# Patient Record
Sex: Female | Born: 1985 | Race: White | Hispanic: No | Smoking: Current every day smoker
Health system: Southern US, Community
[De-identification: ages and names within clinical notes are randomized; demographics above are authoritative.]

## PROBLEM LIST (undated history)

## (undated) DIAGNOSIS — F909 Attention-deficit hyperactivity disorder, unspecified type: Secondary | ICD-10-CM

## (undated) DIAGNOSIS — O009 Unspecified ectopic pregnancy without intrauterine pregnancy: Secondary | ICD-10-CM

## (undated) DIAGNOSIS — J45909 Unspecified asthma, uncomplicated: Secondary | ICD-10-CM

## (undated) DIAGNOSIS — F419 Anxiety disorder, unspecified: Secondary | ICD-10-CM

## (undated) DIAGNOSIS — R011 Cardiac murmur, unspecified: Secondary | ICD-10-CM

## (undated) DIAGNOSIS — F4310 Post-traumatic stress disorder, unspecified: Secondary | ICD-10-CM

## (undated) HISTORY — PX: BACK SURGERY: SHX140

## (undated) HISTORY — DX: Unspecified ectopic pregnancy without intrauterine pregnancy: O00.90

## (undated) HISTORY — DX: Unspecified asthma, uncomplicated: J45.909

---

## 2004-01-30 ENCOUNTER — Emergency Department: Payer: Self-pay | Admitting: Emergency Medicine

## 2004-04-26 ENCOUNTER — Emergency Department: Payer: Self-pay | Admitting: Unknown Physician Specialty

## 2004-08-10 ENCOUNTER — Emergency Department: Payer: Self-pay | Admitting: Unknown Physician Specialty

## 2004-10-18 ENCOUNTER — Emergency Department: Payer: Self-pay | Admitting: Emergency Medicine

## 2004-10-20 ENCOUNTER — Ambulatory Visit: Payer: Self-pay | Admitting: Emergency Medicine

## 2004-12-30 ENCOUNTER — Observation Stay: Payer: Self-pay

## 2005-02-15 ENCOUNTER — Observation Stay: Payer: Self-pay | Admitting: Unknown Physician Specialty

## 2005-04-14 ENCOUNTER — Inpatient Hospital Stay: Payer: Self-pay | Admitting: Unknown Physician Specialty

## 2005-05-10 ENCOUNTER — Emergency Department: Payer: Self-pay | Admitting: Emergency Medicine

## 2005-11-22 ENCOUNTER — Emergency Department: Payer: Self-pay | Admitting: Emergency Medicine

## 2006-07-21 ENCOUNTER — Emergency Department: Payer: Self-pay | Admitting: Emergency Medicine

## 2006-12-06 ENCOUNTER — Emergency Department: Payer: Self-pay | Admitting: Emergency Medicine

## 2007-07-18 ENCOUNTER — Emergency Department: Payer: Self-pay | Admitting: Emergency Medicine

## 2007-10-13 ENCOUNTER — Emergency Department: Payer: Self-pay | Admitting: Emergency Medicine

## 2009-05-19 ENCOUNTER — Emergency Department: Payer: Self-pay | Admitting: Emergency Medicine

## 2009-08-31 ENCOUNTER — Emergency Department: Payer: Self-pay | Admitting: Internal Medicine

## 2009-09-27 ENCOUNTER — Emergency Department: Payer: Self-pay | Admitting: Emergency Medicine

## 2009-10-10 ENCOUNTER — Emergency Department: Payer: Self-pay | Admitting: Emergency Medicine

## 2010-04-10 ENCOUNTER — Emergency Department: Payer: Self-pay | Admitting: Emergency Medicine

## 2010-10-06 ENCOUNTER — Emergency Department: Payer: Self-pay | Admitting: Emergency Medicine

## 2011-03-24 ENCOUNTER — Emergency Department: Payer: Self-pay | Admitting: *Deleted

## 2011-05-05 ENCOUNTER — Emergency Department: Payer: Self-pay | Admitting: Emergency Medicine

## 2011-05-05 LAB — ACETAMINOPHEN LEVEL: Acetaminophen: <2 ug/mL

## 2011-05-05 LAB — URINALYSIS, COMPLETE
Bilirubin,UR: NEGATIVE
Ketone: NEGATIVE
Nitrite: NEGATIVE
Ph: 6 (ref 4.5–8.0)
Protein: NEGATIVE
RBC,UR: 1 /HPF (ref 0–5)
Specific Gravity: 1.011 (ref 1.003–1.030)
Squamous Epithelial: 13

## 2011-05-05 LAB — COMPREHENSIVE METABOLIC PANEL
Albumin: 4.3 g/dL (ref 3.4–5.0)
BUN: 8 mg/dL (ref 7–18)
EGFR (African American): >60
EGFR (Non-African Amer.): >60
Glucose: 72 mg/dL (ref 65–99)
SGOT(AST): 34 U/L (ref 15–37)
SGPT (ALT): 22 U/L
Total Protein: 8 g/dL (ref 6.4–8.2)

## 2011-05-05 LAB — CBC
HGB: 14 g/dL (ref 12.0–16.0)
MCH: 30.5 pg (ref 26.0–34.0)
Platelet: 340 10*3/uL (ref 150–440)
RBC: 4.61 10*6/uL (ref 3.80–5.20)
WBC: 8.4 10*3/uL (ref 3.6–11.0)

## 2011-05-05 LAB — DRUG SCREEN, URINE
Amphetamines, Ur Screen: NEGATIVE (ref ?–1000)
Barbiturates, Ur Screen: NEGATIVE (ref ?–200)
Benzodiazepine, Ur Scrn: POSITIVE (ref ?–200)
Cannabinoid 50 Ng, Ur ~~LOC~~: NEGATIVE (ref ?–50)
Cocaine Metabolite,Ur ~~LOC~~: NEGATIVE (ref ?–300)
Methadone, Ur Screen: NEGATIVE (ref ?–300)
Phencyclidine (PCP) Ur S: NEGATIVE (ref ?–25)
Tricyclic, Ur Screen: NEGATIVE (ref ?–1000)

## 2011-05-05 LAB — PREGNANCY, URINE: Pregnancy Test, Urine: NEGATIVE m[IU]/mL

## 2011-05-05 LAB — TROPONIN I: Troponin-I: <0.02 ng/mL

## 2011-05-05 LAB — SALICYLATE LEVEL: Salicylates, Serum: <1.7 mg/dL

## 2011-05-05 LAB — ETHANOL: Ethanol: 28 mg/dL

## 2011-05-30 ENCOUNTER — Emergency Department: Payer: Self-pay | Admitting: Emergency Medicine

## 2011-05-30 LAB — URINALYSIS, COMPLETE
Bilirubin,UR: NEGATIVE
Glucose,UR: NEGATIVE mg/dL (ref 0–75)
Ketone: NEGATIVE
Protein: NEGATIVE
RBC,UR: 1 /HPF (ref 0–5)
Specific Gravity: 1.002 (ref 1.003–1.030)
WBC UR: 5 /HPF (ref 0–5)

## 2011-05-30 LAB — CBC
HCT: 40 % (ref 35.0–47.0)
HGB: 13.8 g/dL (ref 12.0–16.0)
MCH: 31.7 pg (ref 26.0–34.0)
RBC: 4.35 10*6/uL (ref 3.80–5.20)
WBC: 8.8 10*3/uL (ref 3.6–11.0)

## 2011-05-30 LAB — COMPREHENSIVE METABOLIC PANEL
Albumin: 4.2 g/dL (ref 3.4–5.0)
Alkaline Phosphatase: 63 U/L (ref 50–136)
BUN: 9 mg/dL (ref 7–18)
Bilirubin,Total: 0.2 mg/dL (ref 0.2–1.0)
Chloride: 104 mmol/L (ref 98–107)
Creatinine: 0.89 mg/dL (ref 0.60–1.30)
EGFR (Non-African Amer.): >60
SGPT (ALT): 15 U/L
Sodium: 144 mmol/L (ref 136–145)

## 2011-05-30 LAB — HCG, QUANTITATIVE, PREGNANCY: Beta Hcg, Quant.: <1 m[IU]/mL — ABNORMAL LOW

## 2011-08-23 ENCOUNTER — Emergency Department: Payer: Self-pay | Admitting: Emergency Medicine

## 2011-08-23 LAB — URINALYSIS, COMPLETE
Bacteria: NONE SEEN
Bilirubin,UR: NEGATIVE
Blood: NEGATIVE
Glucose,UR: NEGATIVE mg/dL (ref 0–75)
Ketone: NEGATIVE
Leukocyte Esterase: NEGATIVE
Nitrite: NEGATIVE
Ph: 6 (ref 4.5–8.0)
Protein: NEGATIVE
Transitional Epi: <1

## 2011-08-23 LAB — COMPREHENSIVE METABOLIC PANEL
Anion Gap: 8 (ref 7–16)
BUN: 11 mg/dL (ref 7–18)
Chloride: 107 mmol/L (ref 98–107)
Co2: 26 mmol/L (ref 21–32)
Creatinine: 0.92 mg/dL (ref 0.60–1.30)
Potassium: 3.6 mmol/L (ref 3.5–5.1)
SGPT (ALT): 13 U/L
Sodium: 141 mmol/L (ref 136–145)
Total Protein: 8.2 g/dL (ref 6.4–8.2)

## 2011-08-23 LAB — CBC
HCT: 42.1 % (ref 35.0–47.0)
HGB: 14.5 g/dL (ref 12.0–16.0)
MCH: 31.7 pg (ref 26.0–34.0)
MCHC: 34.4 g/dL (ref 32.0–36.0)
Platelet: 360 10*3/uL (ref 150–440)
RBC: 4.57 10*6/uL (ref 3.80–5.20)
RDW: 12.1 % (ref 11.5–14.5)

## 2011-08-23 LAB — WET PREP, GENITAL

## 2011-08-23 LAB — PREGNANCY, URINE: Pregnancy Test, Urine: NEGATIVE m[IU]/mL

## 2011-10-29 ENCOUNTER — Encounter: Payer: Self-pay | Admitting: Maternal & Fetal Medicine

## 2012-05-10 IMAGING — CT CT HEAD WITHOUT CONTRAST
2 series · 16 of 30 positions shown, 20 images · non-contrast
Comparison: none

REASON FOR EXAM: syncope pt is pregnant/ has had blaeeding and no iup
seen on us/poss incompl ab/
COMMENTS:

[Series 2: without · axial · non-contrast · 0.41mm/px · z∈[+1374,+1494]mm · 13 of 29 slices shown, 17 images]
[im 3/29  brain]
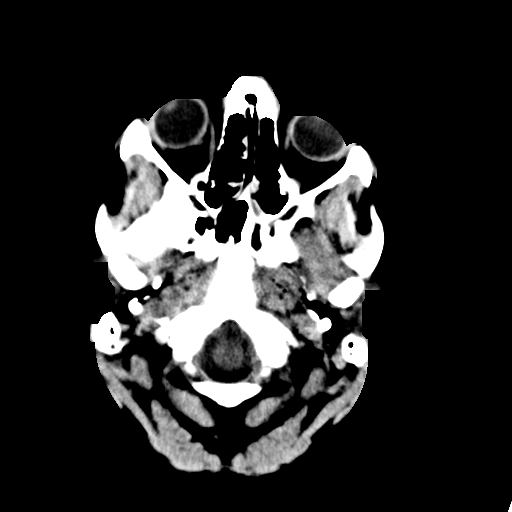
[im 3/29  bone]
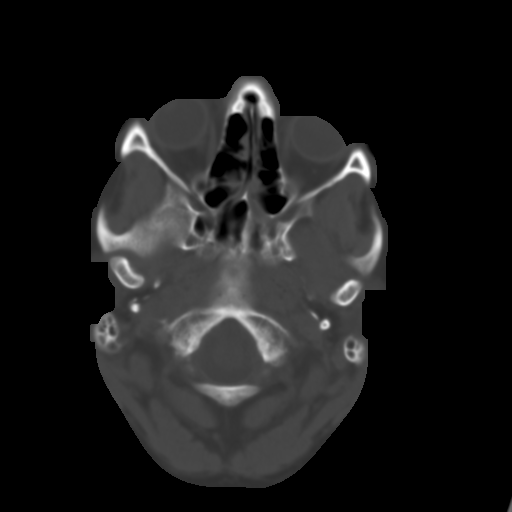
[im 5/29  brain]
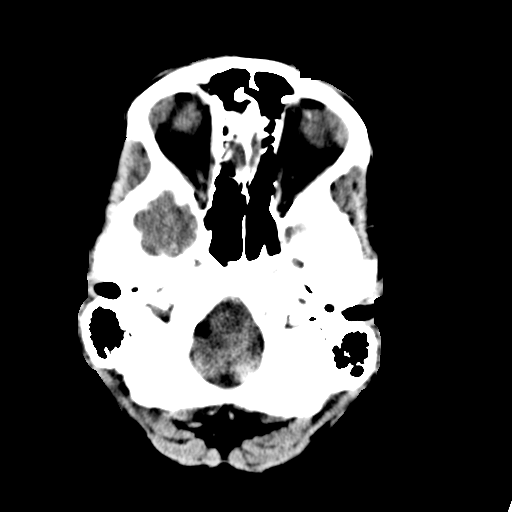
[im 7/29  brain]
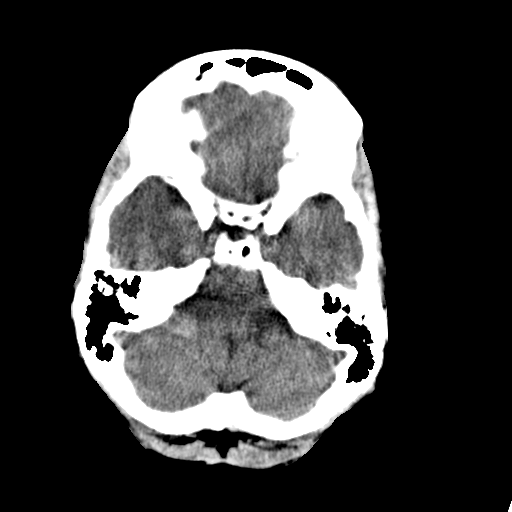
[im 9/29  brain]
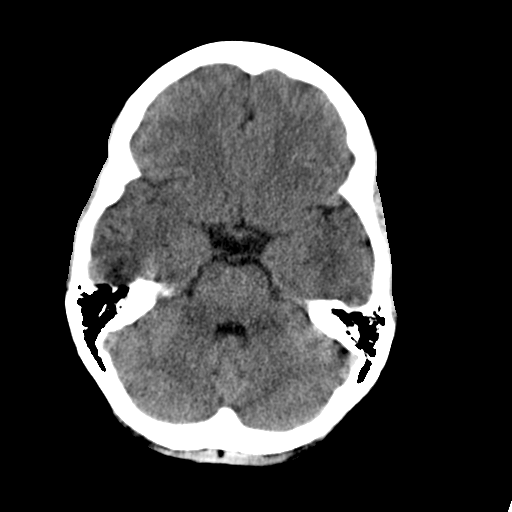
[im 11/29  brain]
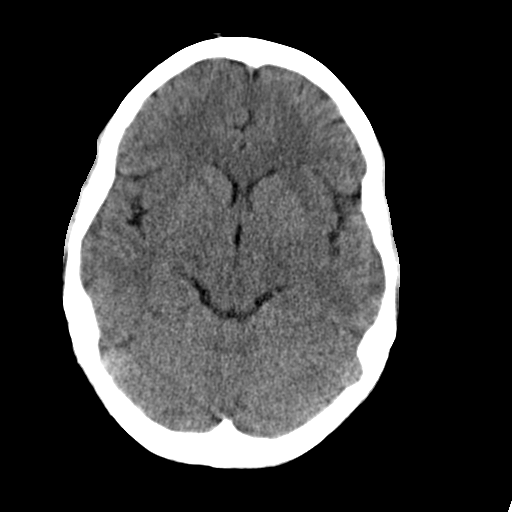
[im 11/29  bone]
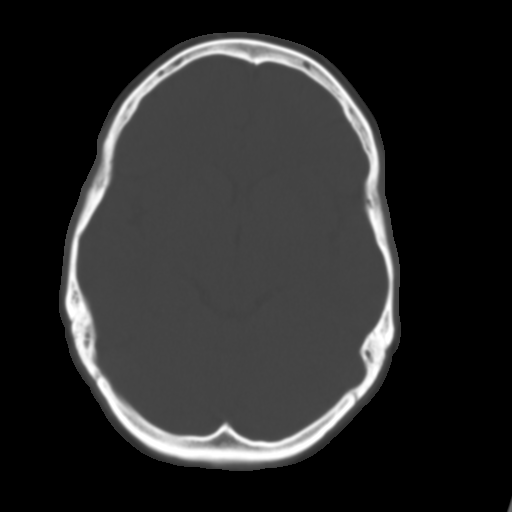
[im 13/29  brain]
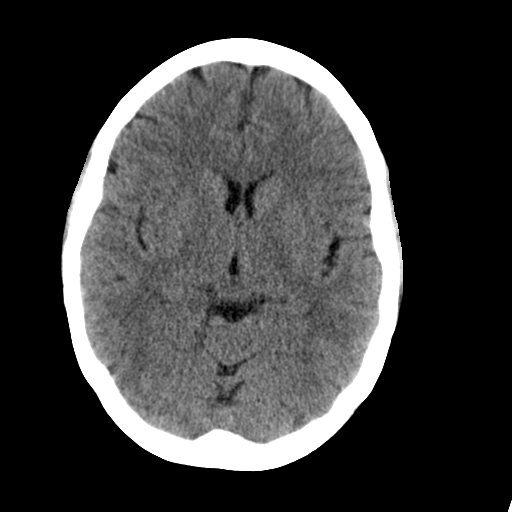
[im 15/29  brain]
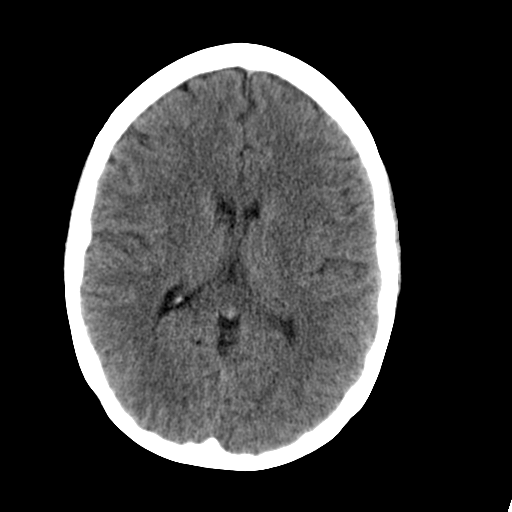
[im 17/29  brain]
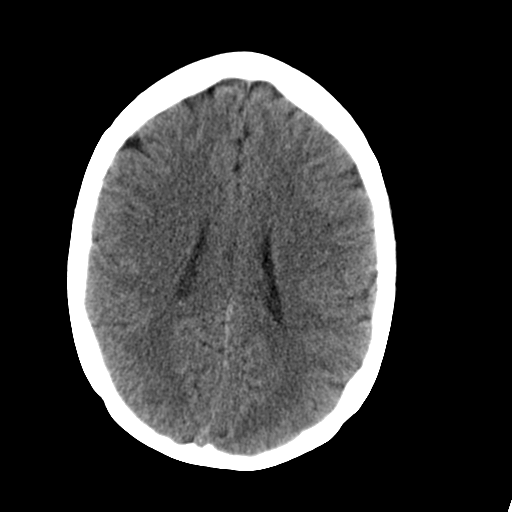
[im 19/29  brain]
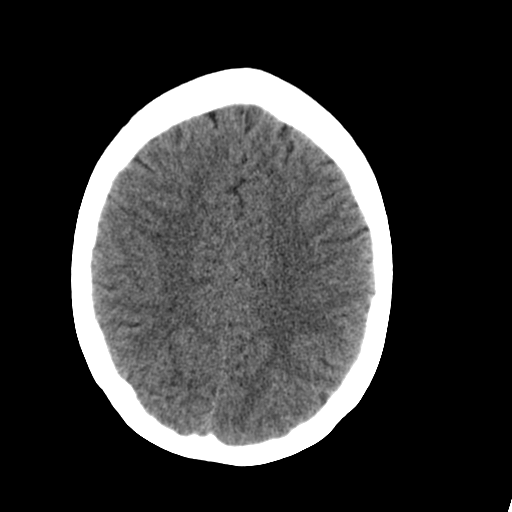
[im 19/29  bone]
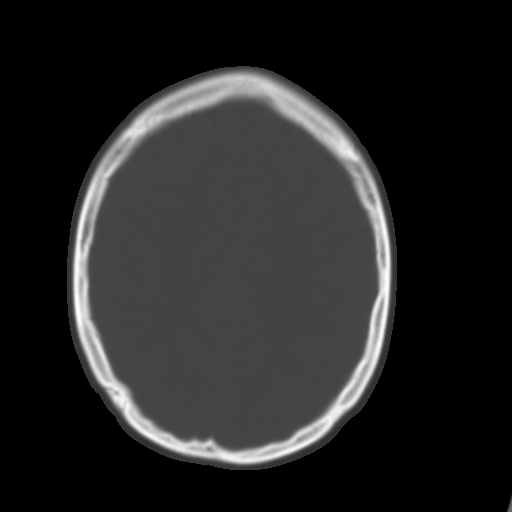
[im 21/29  brain]
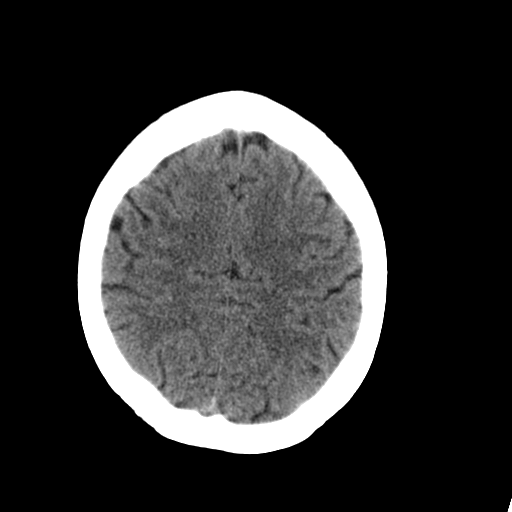
[im 23/29  brain]
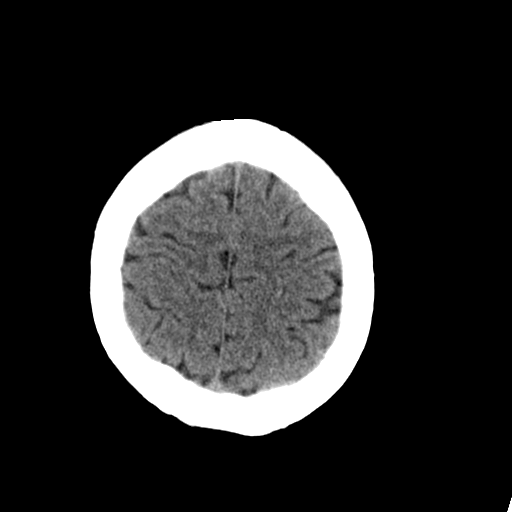
[im 25/29  brain]
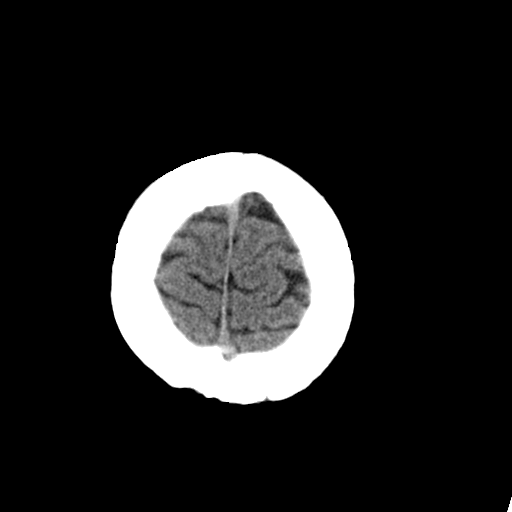
[im 27/29  brain]
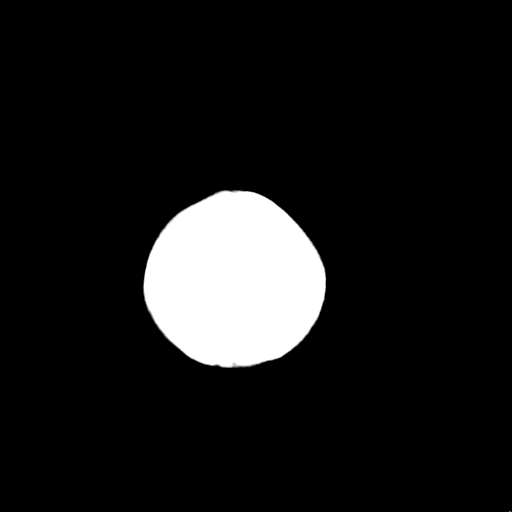
[im 27/29  bone]
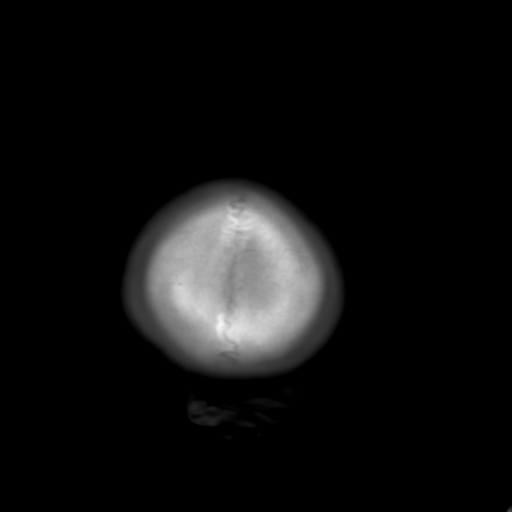

[Series 3: bone · axial · 0.41mm/px · z∈[+1374,+1414]mm · 3 of 29 slices shown]
[im 3/29  bone]
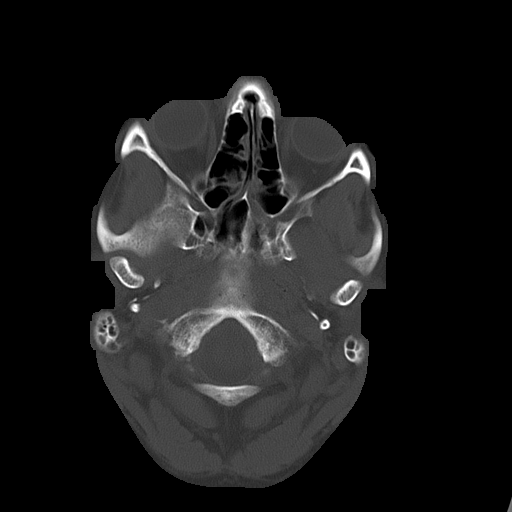
[im 7/29  bone]
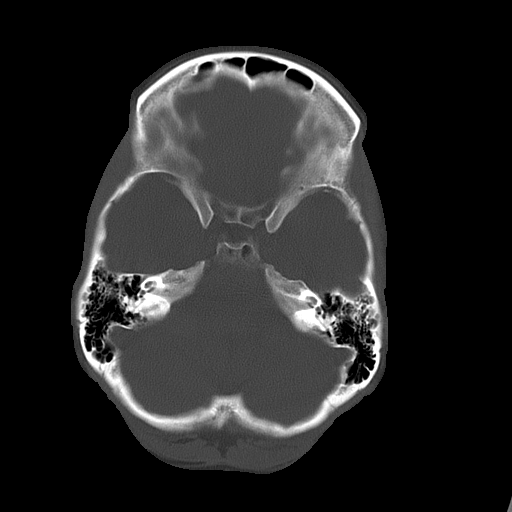
[im 11/29  bone]
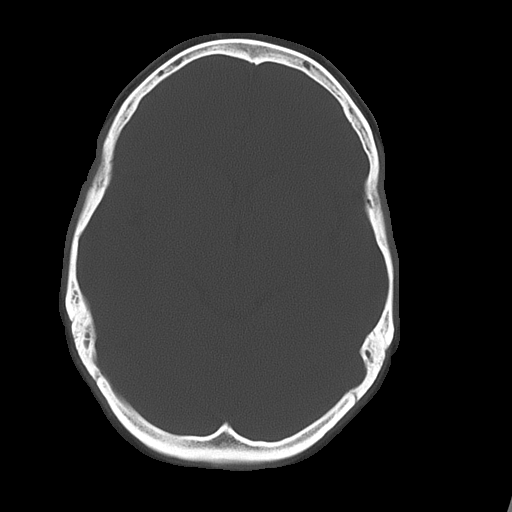

[16 of 30 positions shown; findings below may reference images not displayed]

PROCEDURE:     CT  - CT HEAD WITHOUT CONTRAST  - August 31, 2009 [DATE]

RESULT:     Axial noncontrast CT scanning was performed through the brain at
5 mm intervals and slice thicknesses.

The ventricles are normal in size and position. There is no intracranial
hemorrhage nor intracranial mass effect. The cerebellum and brainstem are
normal in density. At bone window settings a small amount of mucoperiosteal
thickening within the ethmoid and maxillary and sphenoid sinuses is seen. I
see no air-fluid levels. The mastoid air cells are well pneumatized.
IMPRESSION: 1. I do not see evidence of an intracranial hemorrhage nor other acute
intracranial abnormality.
2. There is mild mucoperiosteal thickening involving the ethmoid and
maxillary and sphenoid sinuses which may reflect sinus inflammation.

## 2012-09-08 ENCOUNTER — Other Ambulatory Visit: Payer: Self-pay | Admitting: Obstetrics & Gynecology

## 2012-09-08 LAB — HCG, QUANTITATIVE, PREGNANCY: Beta Hcg, Quant.: <1 m[IU]/mL — ABNORMAL LOW

## 2013-01-10 ENCOUNTER — Emergency Department: Payer: Self-pay | Admitting: Emergency Medicine

## 2013-01-10 LAB — URINALYSIS, COMPLETE
Bilirubin,UR: NEGATIVE
Blood: NEGATIVE
Glucose,UR: NEGATIVE mg/dL (ref 0–75)
Ketone: NEGATIVE
Leukocyte Esterase: NEGATIVE
Ph: 5 (ref 4.5–8.0)
RBC,UR: 2 /HPF (ref 0–5)
Specific Gravity: 1.016 (ref 1.003–1.030)
Squamous Epithelial: 4
WBC UR: 4 /HPF (ref 0–5)

## 2013-01-10 LAB — CBC
HGB: 12.6 g/dL (ref 12.0–16.0)
MCH: 30.7 pg (ref 26.0–34.0)
MCHC: 34.5 g/dL (ref 32.0–36.0)
Platelet: 254 10*3/uL (ref 150–440)
RBC: 4.11 10*6/uL (ref 3.80–5.20)

## 2013-01-10 LAB — HCG, QUANTITATIVE, PREGNANCY: Beta Hcg, Quant.: 47675 m[IU]/mL — ABNORMAL HIGH

## 2013-05-25 ENCOUNTER — Inpatient Hospital Stay: Payer: Self-pay

## 2013-05-25 LAB — URINALYSIS, COMPLETE
BILIRUBIN, UR: NEGATIVE
BLOOD: NEGATIVE
GLUCOSE, UR: NEGATIVE mg/dL (ref 0–75)
Ketone: NEGATIVE
Leukocyte Esterase: NEGATIVE
Nitrite: NEGATIVE
Ph: 6 (ref 4.5–8.0)
Protein: NEGATIVE
RBC,UR: 1 /HPF (ref 0–5)
Specific Gravity: 1.011 (ref 1.003–1.030)
Squamous Epithelial: 2
WBC UR: 3 /HPF (ref 0–5)

## 2013-05-25 LAB — DRUG SCREEN, URINE
Amphetamines, Ur Screen: NEGATIVE (ref ?–1000)
BENZODIAZEPINE, UR SCRN: POSITIVE (ref ?–200)
Barbiturates, Ur Screen: NEGATIVE (ref ?–200)
Cannabinoid 50 Ng, Ur ~~LOC~~: NEGATIVE (ref ?–50)
Cocaine Metabolite,Ur ~~LOC~~: NEGATIVE (ref ?–300)
MDMA (ECSTASY) UR SCREEN: NEGATIVE (ref ?–500)
METHADONE, UR SCREEN: NEGATIVE (ref ?–300)
OPIATE, UR SCREEN: NEGATIVE (ref ?–300)
Phencyclidine (PCP) Ur S: NEGATIVE (ref ?–25)
TRICYCLIC, UR SCREEN: NEGATIVE (ref ?–1000)

## 2013-05-25 LAB — FETAL FIBRONECTIN
Appearance: NORMAL
Fetal Fibronectin: NEGATIVE

## 2013-05-26 LAB — COMPREHENSIVE METABOLIC PANEL
ALBUMIN: 2.5 g/dL — AB (ref 3.4–5.0)
ALT: 11 U/L — AB (ref 12–78)
Alkaline Phosphatase: 63 U/L
Anion Gap: 4 — ABNORMAL LOW (ref 7–16)
BUN: 6 mg/dL — ABNORMAL LOW (ref 7–18)
Bilirubin,Total: 0.2 mg/dL (ref 0.2–1.0)
CHLORIDE: 107 mmol/L (ref 98–107)
CO2: 25 mmol/L (ref 21–32)
Calcium, Total: 8.2 mg/dL — ABNORMAL LOW (ref 8.5–10.1)
Creatinine: 0.67 mg/dL (ref 0.60–1.30)
EGFR (African American): >60
EGFR (Non-African Amer.): >60
Glucose: 88 mg/dL (ref 65–99)
Osmolality: 269 (ref 275–301)
POTASSIUM: 3.7 mmol/L (ref 3.5–5.1)
SGOT(AST): 21 U/L (ref 15–37)
SODIUM: 136 mmol/L (ref 136–145)
Total Protein: 5.8 g/dL — ABNORMAL LOW (ref 6.4–8.2)

## 2013-05-26 LAB — CBC WITH DIFFERENTIAL/PLATELET
BASOS ABS: 0 10*3/uL (ref 0.0–0.1)
Basophil %: 0.1 %
Eosinophil #: 0 10*3/uL (ref 0.0–0.7)
Eosinophil %: 0.2 %
HCT: 28.2 % — ABNORMAL LOW (ref 35.0–47.0)
HGB: 10 g/dL — AB (ref 12.0–16.0)
Lymphocyte #: 1.6 10*3/uL (ref 1.0–3.6)
Lymphocyte %: 7.1 %
MCH: 33.1 pg (ref 26.0–34.0)
MCHC: 35.3 g/dL (ref 32.0–36.0)
MCV: 94 fL (ref 80–100)
MONO ABS: 1.3 x10 3/mm — AB (ref 0.2–0.9)
MONOS PCT: 5.7 %
NEUTROS PCT: 86.9 %
Neutrophil #: 19 10*3/uL — ABNORMAL HIGH (ref 1.4–6.5)
Platelet: 255 10*3/uL (ref 150–440)
RBC: 3.01 10*6/uL — AB (ref 3.80–5.20)
RDW: 12.5 % (ref 11.5–14.5)
WBC: 21.9 10*3/uL — ABNORMAL HIGH (ref 3.6–11.0)

## 2013-05-27 LAB — HEMATOCRIT: HCT: 22.8 % — ABNORMAL LOW (ref 35.0–47.0)

## 2013-05-28 LAB — URINE CULTURE

## 2013-05-29 LAB — PATHOLOGY REPORT

## 2013-07-26 ENCOUNTER — Emergency Department: Payer: Self-pay | Admitting: Emergency Medicine

## 2013-07-26 LAB — COMPREHENSIVE METABOLIC PANEL
ALK PHOS: 52 U/L
ANION GAP: 7 (ref 7–16)
Albumin: 3.7 g/dL (ref 3.4–5.0)
BUN: 7 mg/dL (ref 7–18)
Bilirubin,Total: 0.4 mg/dL (ref 0.2–1.0)
CALCIUM: 8.8 mg/dL (ref 8.5–10.1)
CHLORIDE: 105 mmol/L (ref 98–107)
CO2: 25 mmol/L (ref 21–32)
CREATININE: 0.93 mg/dL (ref 0.60–1.30)
EGFR (African American): >60
EGFR (Non-African Amer.): >60
GLUCOSE: 94 mg/dL (ref 65–99)
Osmolality: 272 (ref 275–301)
POTASSIUM: 4 mmol/L (ref 3.5–5.1)
SGOT(AST): 20 U/L (ref 15–37)
SGPT (ALT): 21 U/L (ref 12–78)
Sodium: 137 mmol/L (ref 136–145)
TOTAL PROTEIN: 7.3 g/dL (ref 6.4–8.2)

## 2013-07-26 LAB — URINALYSIS, COMPLETE
Bacteria: NONE SEEN
Bilirubin,UR: NEGATIVE
Glucose,UR: NEGATIVE mg/dL (ref 0–75)
Ketone: NEGATIVE
Leukocyte Esterase: NEGATIVE
NITRITE: NEGATIVE
PH: 5 (ref 4.5–8.0)
Protein: NEGATIVE
RBC,UR: NONE SEEN /HPF (ref 0–5)
Specific Gravity: 1.01 (ref 1.003–1.030)
Squamous Epithelial: 2

## 2013-07-26 LAB — DRUG SCREEN, URINE
Amphetamines, Ur Screen: NEGATIVE (ref ?–1000)
BENZODIAZEPINE, UR SCRN: POSITIVE (ref ?–200)
Barbiturates, Ur Screen: NEGATIVE (ref ?–200)
COCAINE METABOLITE, UR ~~LOC~~: NEGATIVE (ref ?–300)
Cannabinoid 50 Ng, Ur ~~LOC~~: NEGATIVE (ref ?–50)
MDMA (Ecstasy)Ur Screen: NEGATIVE (ref ?–500)
Methadone, Ur Screen: NEGATIVE (ref ?–300)
Opiate, Ur Screen: NEGATIVE (ref ?–300)
Phencyclidine (PCP) Ur S: NEGATIVE (ref ?–25)
Tricyclic, Ur Screen: NEGATIVE (ref ?–1000)

## 2013-07-26 LAB — CBC
HCT: 34.7 % — ABNORMAL LOW (ref 35.0–47.0)
HGB: 11.5 g/dL — AB (ref 12.0–16.0)
MCH: 29.3 pg (ref 26.0–34.0)
MCHC: 33.1 g/dL (ref 32.0–36.0)
MCV: 89 fL (ref 80–100)
Platelet: 296 10*3/uL (ref 150–440)
RBC: 3.92 10*6/uL (ref 3.80–5.20)
RDW: 14 % (ref 11.5–14.5)
WBC: 8.6 10*3/uL (ref 3.6–11.0)

## 2013-07-26 LAB — ETHANOL: Ethanol %: <0.003 % (ref 0.000–0.080)

## 2013-07-27 ENCOUNTER — Emergency Department: Payer: Self-pay | Admitting: Emergency Medicine

## 2013-07-27 LAB — CBC
HCT: 37.9 % (ref 35.0–47.0)
HGB: 12.5 g/dL (ref 12.0–16.0)
MCH: 29.6 pg (ref 26.0–34.0)
MCHC: 33.1 g/dL (ref 32.0–36.0)
MCV: 89 fL (ref 80–100)
Platelet: 347 10*3/uL (ref 150–440)
RBC: 4.24 10*6/uL (ref 3.80–5.20)
RDW: 13.8 % (ref 11.5–14.5)
WBC: 9.8 10*3/uL (ref 3.6–11.0)

## 2013-07-27 LAB — COMPREHENSIVE METABOLIC PANEL
ALBUMIN: 4.1 g/dL (ref 3.4–5.0)
ANION GAP: 3 — AB (ref 7–16)
Alkaline Phosphatase: 60 U/L
BILIRUBIN TOTAL: 0.4 mg/dL (ref 0.2–1.0)
BUN: 7 mg/dL (ref 7–18)
CREATININE: 0.76 mg/dL (ref 0.60–1.30)
Calcium, Total: 8.8 mg/dL (ref 8.5–10.1)
Chloride: 107 mmol/L (ref 98–107)
Co2: 29 mmol/L (ref 21–32)
EGFR (African American): >60
EGFR (Non-African Amer.): >60
GLUCOSE: 109 mg/dL — AB (ref 65–99)
OSMOLALITY: 276 (ref 275–301)
Potassium: 3.6 mmol/L (ref 3.5–5.1)
SGOT(AST): 37 U/L (ref 15–37)
SGPT (ALT): 25 U/L (ref 12–78)
SODIUM: 139 mmol/L (ref 136–145)
Total Protein: 7.9 g/dL (ref 6.4–8.2)

## 2013-12-02 IMAGING — CR DG CHEST 2V
1 series · 2 of 2 positions shown · non-contrast
Comparison: none

REASON FOR EXAM: trauma, pain, sob
COMMENTS:

PROCEDURE:     DXR - DXR CHEST PA (OR AP) AND LATERAL  - March 25, 2011  [DATE]
RESULT:     The lungs are clear. The heart and pulmonary vessels are normal.
The bony and mediastinal structures are unremarkable. There is no effusion.
There is no pneumothorax or evidence of congestive failure.

[Series 1: w chest ap · 0.14mm/px · 2 of 2 slices shown]
[im 1/2]
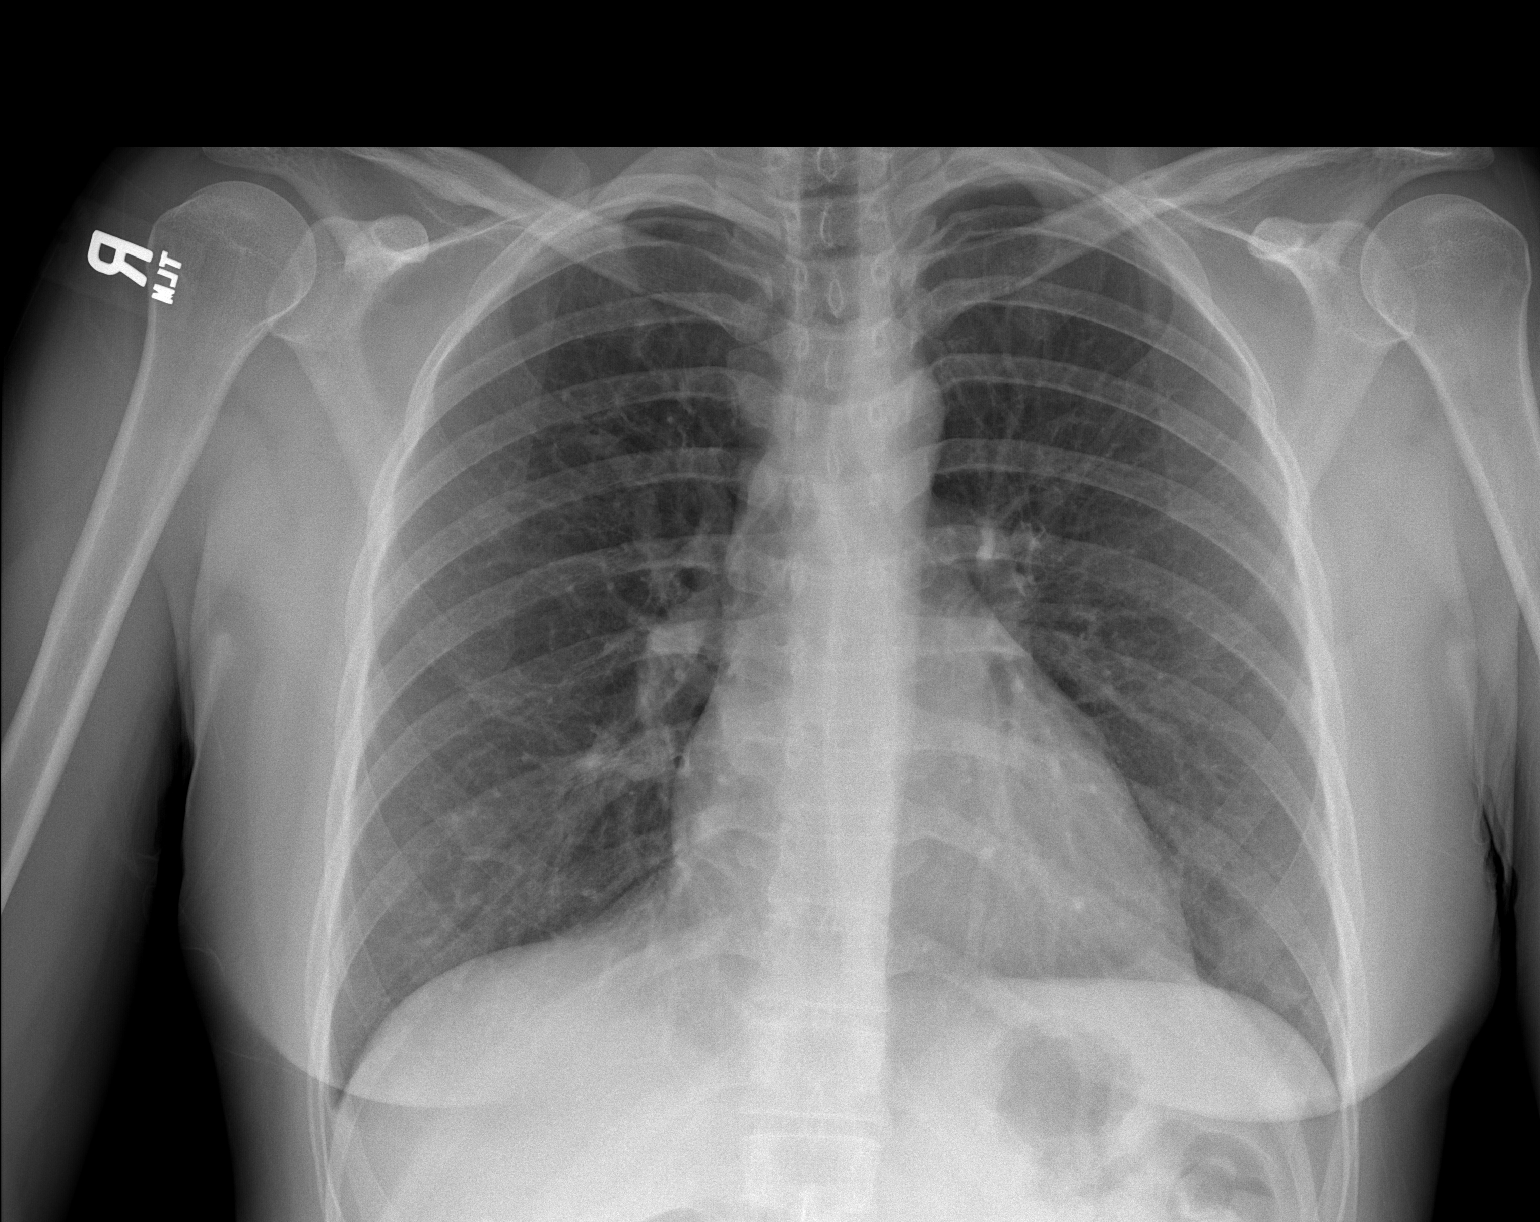
[im 2/2]
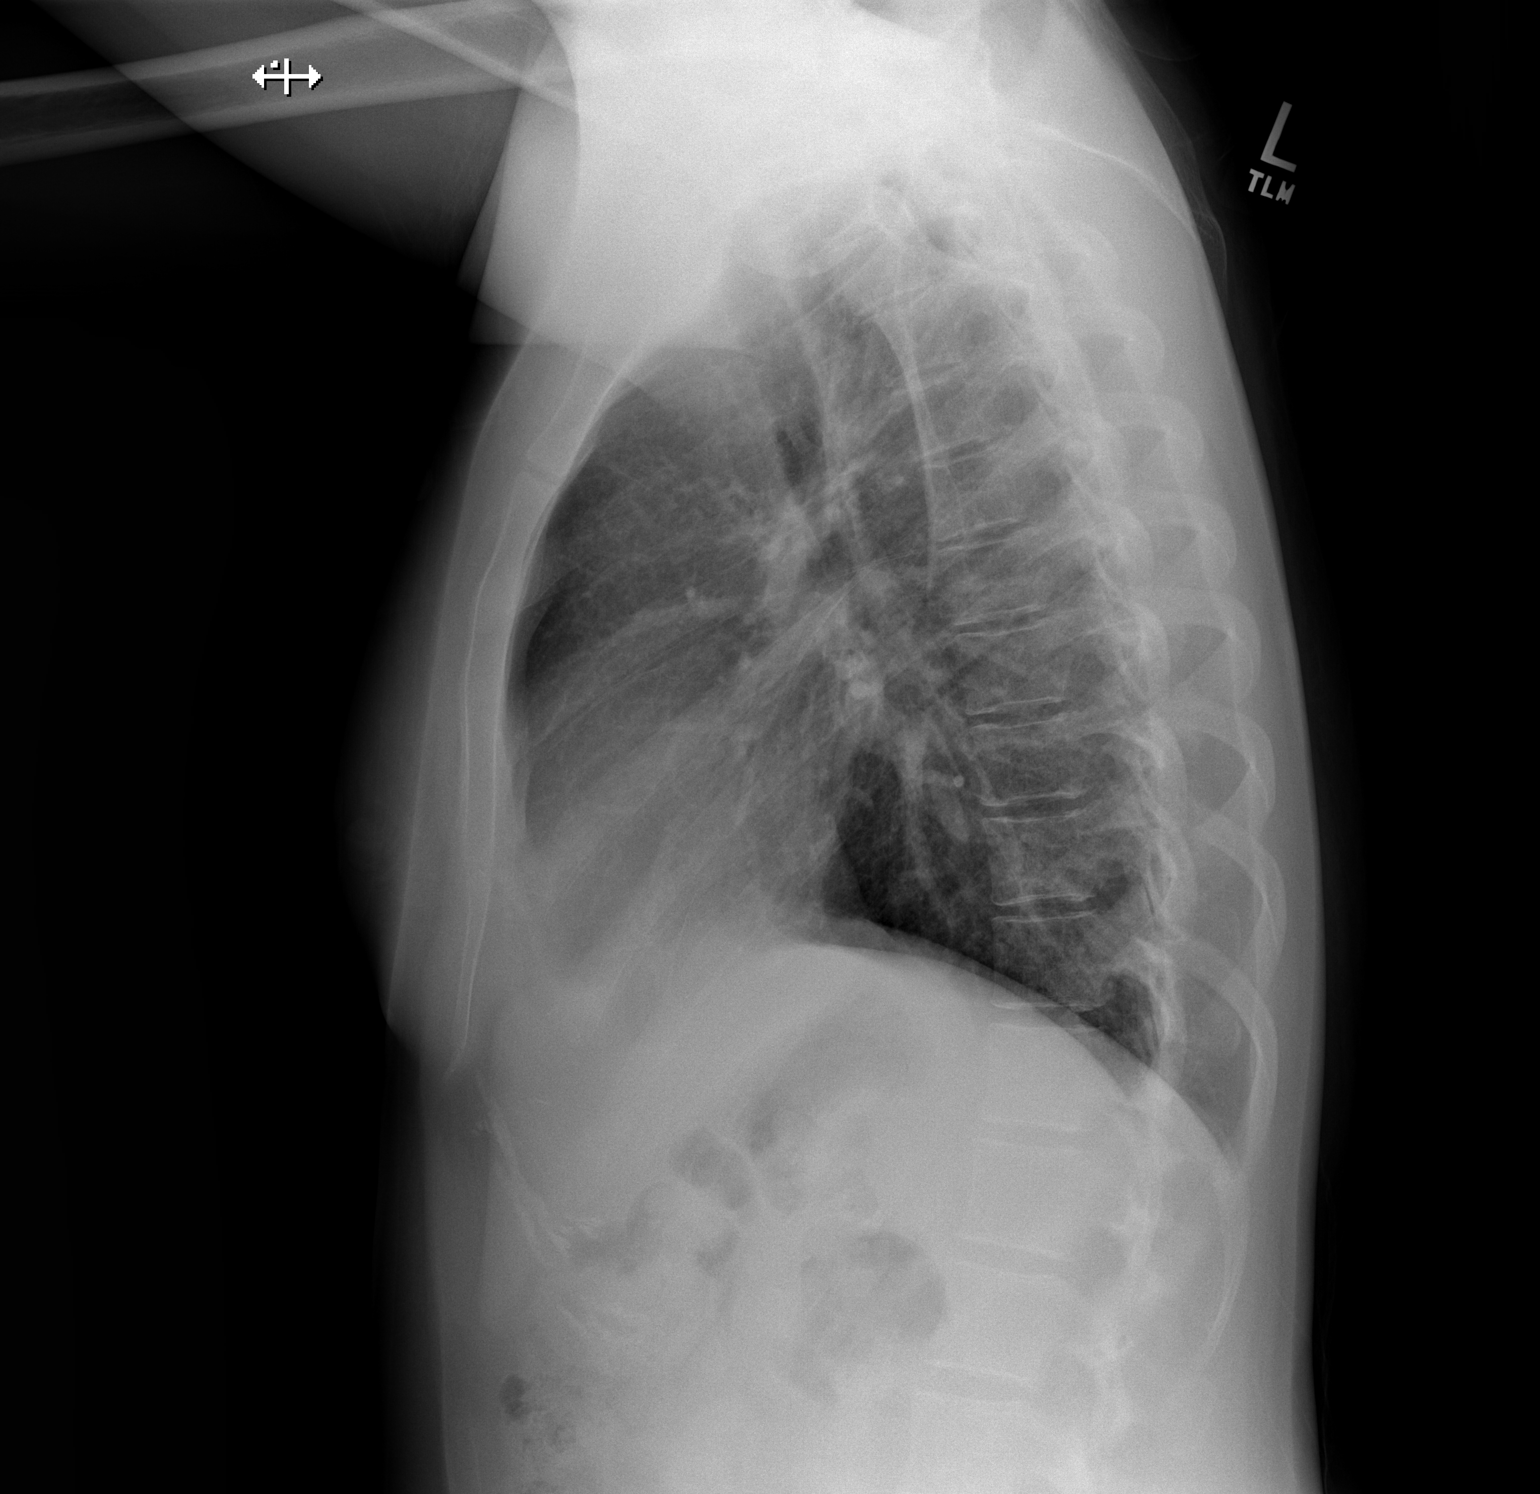

[2 of 2 positions shown; findings below may reference images not displayed]

IMPRESSION: No acute cardiopulmonary disease. Stable appearance
compared to previous examination dated 10 April, 2010.

## 2014-07-21 NOTE — Consult Note (Signed)
Brief Consult Note: Diagnosis: seizure.   Patient was seen by consultant.   Consult note dictated.   Orders entered.   Discussed with Attending MD.   Comments: 27yCF pmhx seizure about 7 years ago, p/w abd pain/contractions, had seizure - per pt has not received her xanax medicine in about 48 hours. 1. seizure: likely related to bzd withdrawal: CIWA protocal, half life on xanax is about 12 hours, it is quite possible that her UDS would be positive even if she had not had her meds recently (it could be positive for about 5 days without taking her xanax) will follow labs to see if any other etiology.  Electronic Signatures: Wyatt HasteHower, David K (MD)  (Signed 27-Feb-15 00:40)  Authored: Brief Consult Note   Last Updated: 27-Feb-15 00:40 by Wyatt HasteHower, David K (MD)

## 2014-07-21 NOTE — Consult Note (Signed)
**Note Andrea-Identified via Obfuscation** PATIENT NAME:  Andrea Gamble, Andrea Gamble MR#:  [id number] DATE OF BIRTH:  Mar 16, 1986  DATE OF CONSULTATION:  05/26/2013  REFERRING PHYSICIAN:  Adria Devonarrie Klett, M.D. CONSULTING PHYSICIAN:  Cletis Athensavid K. Gene Glazebrook, MD  REASON FOR CONSULTATION:  Seizure.  HISTORY OF PRESENT ILLNESS:  Ms. Cliffton AstersWhite is a 29 year old Caucasian female with past medical history of substance abuse, anxiety and history of seizure who originally presented on 05/25/2013 for abdominal pain at this time.  She is approximately [redacted] weeks pregnant.  She is also noted to be having contractions with original thought of giving her IV fluid hydration to see if contractions stop; however, the patient was noted to be anxious, agitated, tachycardic and somewhat confused during work-up.  She subsequently had a seizure while in the hospital.  This prompted fetal distress which required emergent C-section to be performed.  She is now being evaluated post C-section and she is currently asymptomatic.  As far as her seizure goes it was witnessed by both her partner and medical staff as a description of grand mal seizure with postictal state of approximately five minutes.  The patient herself does not recall any of the event.  She states that she has had a seizure in the past, but has been approximately seven years ago and has had no seizure symptoms since that time.  She is not on any antiepileptic medication.  This would be her 29 second seizure.  She also denotes that she was previously on Xanax receiving 8 mg daily, however this was recently reduced to 6 mg, states that she has been out of medications and has not taken any Xanax for approximately 48 hours.  Currently, she is only complaining of postoperative pain as expected.   REVIEW OF SYSTEMS:  CONSTITUTIONAL:  Denies any fevers, chills.  Positive for fatigue.  EYES:  Denies blurred vision, double vision, eye pain.  EARS, NOSE, THROAT:  Denies tinnitus, ear pain, hearing loss.  RESPIRATORY:  Denies cough, wheeze, shortness  of breath.  CARDIOVASCULAR:  Denies chest pain, palpitations, edema.  GASTROINTESTINAL:  Denies nausea, vomiting, diarrhea.  Positive for abdominal pain post surgically.  GENITOURINARY:  Denies dysuria, hematuria.  ENDOCRINE:  Denies nocturia or thyroid problems.  HEMATOLOGIC AND LYMPHATIC:  Denies easy bruising, bleeding.  SKIN:  Denies rashes or lesions.  MUSCULOSKELETAL:  Denies pain in neck, back, shoulder, knees, hips or arthritic symptoms.  NEUROLOGIC:  Denies any current numbness, weakness, dysarthria, tremor, vertigo, ataxia, headache.  Positive for seizure as described above.  PSYCHIATRIC:  Denies any anxiety or depressive symptoms.  Otherwise, full review of systems performed by me is negative.   PAST MEDICAL HISTORY:  Opioid dependence, anxiety and asthma.   SOCIAL HISTORY:  Current every day smoker.  Denies any alcohol or current drug use.  Denies IV drug usage.   FAMILY HISTORY:  Positive for depression and COPD.   ALLERGIES:  KEFLEX, PENICILLIN AND SULFA DRUGS.   HOME MEDICATIONS:  Include Xanax 2 mg by mouth 3 times daily, albuterol 2 puffs every eight hours as needed for shortness of breath.  Prenatal multivitamins 2 tablets daily.  She also takes Subutex 8 mg by mouth 3 times daily.   PHYSICAL EXAMINATION:  GENERAL:  Well-nourished, well-developed, Caucasian female, currently in minimal distress secondary to abdominal pain.  HEAD:  Normocephalic, atraumatic.  EYES:  Pupils equal, round, reactive to light.  Extraocular muscles intact.  No scleral icterus.  MOUTH:  Moist mucosal membranes.  Poor dentition.  No abscess noted.  EARS, NOSE, THROAT:  Throat clear without exudates.  No external lesions.  NECK:  Supple.  No thyromegaly.  No nodules.  No JVD.  PULMONARY:  Clear to auscultation bilaterally without wheezes, rubs or rhonchi.  No use of accessory muscles.  Good respiratory effort.  CHEST:  Nontender to palpation.  CARDIOVASCULAR:  S1, S2, regular rate and rhythm.   No murmurs, rubs, or gallops.  No edema.  Pedal pulses 2+ bilaterally.  GASTROINTESTINAL:  Soft, tenderness over surgical site as expected, nondistended.  No masses.  Positive bowel sounds.  No hepatosplenomegaly.  MUSCULOSKELETAL:  No swelling, clubbing, edema.  Range of motion full in all extremities.  NEUROLOGICAL:  Cranial nerves II through XII intact.  No gross focal neurological deficits.  Sensation intact.  Reflexes intact.  Pronator drift within normal limits.  Gait deferred at this time.  SKIN:  No ulcerations, lesions, rashes, cyanosis.  Skin warm, dry.  Turgor intact.  PSYCHIATRIC:  Mood and affect appear blunted.  She is awake, alert, oriented x 3.  Insight and judgment intact.   LABORATORY DATA:  Urine drug screen positive for benzodiazepines.  Urinalysis negative for evidence of infection.  No proteinuria.  Remainder of laboratory data pending at this time.   ASSESSMENT AND PLAN:  A 29 year old Caucasian female with past medical history of asthma, substances abuse as well as remote history of seizure presenting with abdominal pain, contractions, subsequently had a seizure which lead to fetal distress and emergent Cesarean section.  I am being asked for medical consultation secondary to seizure.  1.  Seizure.  From the description, this was a grand mal seizure, possibly brought on by substance withdrawal.  As per documentation, she was agitated, anxious, tachycardic, nauseous prior to the onset of her seizure.  She states that she has not had any doses of Xanax over the last 24 hours and has only taken one dose in the last 48 hours.  The half-life of Xanax is about 12 hours.  The standard urine drug screen testing for Xanax can detect up to 0.000105 mg or 105 ng, thus it is quite possible to be positive in her urine even if she has not had this medication in the last 48 hours.  Given that her symptoms are now well-controlled, we will initiate CIWA protocol, place her on remote telemetry.  We  will check a CBC and BMP to see if there are any other outstanding etiologies, but I feel this is most likely related to her medications though once again we will reinitiate her medications and placing her on CIWA protocol with Ativan by mouth as required for symptom control.  2.  Post Cesarean section.  We will defer management to primary service including pain medications.  3.  History of well-controlled asthma.  Would recommend continuing the albuterol as needed for shortness of breath.  4.  VTE prophylactic, would recommend sequential compression devices in the postsurgical setting and may use heparin when approved by the primary service.   Thank you very much for your time.  We will continue to follow along in the care and management of Ms. Latavia Dilmore.    ____________________________ Cletis Athens. Jahron Hunsinger, MD dkh:ea D: 05/26/2013 00:35:32 ET T: 05/26/2013 06:47:41 ET JOB#: [id number]  cc: Cletis Athens. Shewanda Sharpe, MD, <Dictator> Shoshannah Faubert Synetta Shadow MD ELECTRONICALLY SIGNED 05/26/2013 20:17

## 2014-07-21 NOTE — Op Note (Signed)
PATIENT NAME:  [name], Andrea Gamble MR#:  [id number] DATE OF BIRTH:  Aug 21, 1985  DATE OF PROCEDURE:  05/27/2013  PREOPERATIVE DIAGNOSES: Maternal seizure, most likely drug-induced, fetal distress, remote from delivery, preterm.   POSTOPERATIVE DIAGNOSES: Maternal seizure, most likely drug-induced, fetal distress, remote from delivery, preterm.   PROCEDURE: STAT tertiary low uterine transverse cesarean section.   SURGEON: Elliot Gurneyarrie C. Roshaun Pound, MD  ESTIMATED BLOOD LOSS: 1000 mL.   FINDINGS: Preterm liveborn female infant with no signs of abruption or rupture of the uterus. Normal uterus, tubes and ovaries.   DESCRIPTION OF PROCEDURE: The patient was taken to the operating room and placed in the supine position. As general endotracheal anesthesia was instilled, the patient was being prepped for a STAT C-section. The Foley catheter had been placed. Pfannenstiel skin incision was made through her old incision and carried sharply down to the fascia. Fascia was nicked in midline, and incision was extended with the curved Mayo scissors. Kocher's were used to separate the rectus fascia and rectus muscle. The muscle midline was identified. The fingers were used to split open the muscles. The peritoneum was grasped and bluntly entered. A bladder blade was placed. Knife was used to make a uterine incision. The infant's head with nuchal cord x1 was delivered. Cord was milked back into the baby. Cord was clamped and cut. Cord gas was sent. Baby was sent to the NICU. Pitocin was started. Ancef was given. The patient's uterus was exteriorized and wrapped in a moist laparotomy sponge. The interior was curetted. Uterine incision was closed with a running locked chromic and a running embrocating suture. The belly was irrigated and cleared of clots. The uterus was placed back into the abdomen. The gutters were cleared. Muscle bellies were approximated with a running Vicryl suture. On-Q trocars were placed. Catheters were threaded and  wrapped between the rectus muscle and the rectus fascia. The fascia was closed with a running Vicryl suture. The 4-0 Monocryl was used to approximate the skin edges. A 4 x 4 was placed over the catheters to protect the skin. Tegaderm was placed. Benzoin and Steri-Strips were placed on the skin incision. Telfa and bandages were placed. The uterus was massaged. Clots were expressed. Clear urine was noted in the Foley bag. The patient was taken to recovery after having tolerated the procedure well.   ____________________________ Elliot Gurneyarrie C. Paulanthony Gleaves, MD cck:lb D: 05/27/2013 05:31:00 ET T: 05/27/2013 05:59:19 ET JOB#: [id number]  cc: Elliot Gurneyarrie C. Annalina Needles, MD, <Dictator> Elliot GurneyARRIE C Elexius Minar MD ELECTRONICALLY SIGNED 05/29/2013 11:26

## 2014-07-21 NOTE — Op Note (Signed)
**Note Andrea-Identified via Obfuscation** PATIENT NAME:  Andrea Gamble, Andrea Gamble MR#:  [id number] DATE OF BIRTH:  02-27-1986  DATE OF PROCEDURE:  05/27/2013  ADDENDUM  The assistant to this emergent C-section was Milon Scorearon Jones, CNM, certified nurse midwife who assisted with a patient who was seizing and a fetus that was in fetal distress.  This was typed into the computer form on the OB/GYN brief postop note immediately following surgery, but was not dictated into the verbal report.    ____________________________ Elliot Gurneyarrie C. Macio Kissoon, MD cck:ea D: 06/07/2013 00:21:54 ET T: 06/07/2013 03:35:56 ET JOB#: [id number]  cc: Elliot Gurneyarrie C. Khylie Larmore, MD, <Dictator> Elliot GurneyARRIE C Guinevere Stephenson MD ELECTRONICALLY SIGNED 06/09/2013 22:00
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
## 2014-08-16 ENCOUNTER — Emergency Department: Payer: Self-pay

## 2014-08-16 ENCOUNTER — Emergency Department
Admission: EM | Admit: 2014-08-16 | Discharge: 2014-08-17 | Disposition: A | Discharge status: Home / Self Care | Payer: Self-pay | Attending: Emergency Medicine | Admitting: Emergency Medicine

## 2014-08-16 ENCOUNTER — Encounter: Payer: Self-pay | Admitting: Emergency Medicine

## 2014-08-16 ENCOUNTER — Other Ambulatory Visit: Payer: Self-pay

## 2014-08-16 DIAGNOSIS — F1490 Cocaine use, unspecified, uncomplicated: Secondary | ICD-10-CM

## 2014-08-16 DIAGNOSIS — E876 Hypokalemia: Secondary | ICD-10-CM

## 2014-08-16 DIAGNOSIS — T50901A Poisoning by unspecified drugs, medicaments and biological substances, accidental (unintentional), initial encounter: Secondary | ICD-10-CM

## 2014-08-16 DIAGNOSIS — Z3202 Encounter for pregnancy test, result negative: Secondary | ICD-10-CM | POA: Insufficient documentation

## 2014-08-16 DIAGNOSIS — Z720 Tobacco use: Secondary | ICD-10-CM | POA: Insufficient documentation

## 2014-08-16 DIAGNOSIS — F1310 Sedative, hypnotic or anxiolytic abuse, uncomplicated: Secondary | ICD-10-CM | POA: Insufficient documentation

## 2014-08-16 DIAGNOSIS — F10129 Alcohol abuse with intoxication, unspecified: Secondary | ICD-10-CM | POA: Insufficient documentation

## 2014-08-16 DIAGNOSIS — F10920 Alcohol use, unspecified with intoxication, uncomplicated: Secondary | ICD-10-CM

## 2014-08-16 DIAGNOSIS — T50904A Poisoning by unspecified drugs, medicaments and biological substances, undetermined, initial encounter: Secondary | ICD-10-CM | POA: Insufficient documentation

## 2014-08-16 HISTORY — DX: Attention-deficit hyperactivity disorder, unspecified type: F90.9

## 2014-08-16 LAB — URINE DRUG SCREEN, QUALITATIVE (ARMC ONLY)
Amphetamines, Ur Screen: NOT DETECTED
Barbiturates, Ur Screen: NOT DETECTED
Benzodiazepine, Ur Scrn: POSITIVE — AB
CANNABINOID 50 NG, UR ~~LOC~~: NOT DETECTED
COCAINE METABOLITE, UR ~~LOC~~: POSITIVE — AB
MDMA (ECSTASY) UR SCREEN: NOT DETECTED
Methadone Scn, Ur: NOT DETECTED
Opiate, Ur Screen: NOT DETECTED
Phencyclidine (PCP) Ur S: NOT DETECTED
Tricyclic, Ur Screen: NOT DETECTED

## 2014-08-16 LAB — ETHANOL: Alcohol, Ethyl (B): 203 mg/dL — ABNORMAL HIGH (ref <5)

## 2014-08-16 LAB — COMPREHENSIVE METABOLIC PANEL
ALK PHOS: 34 U/L — AB (ref 38–126)
ALT: 8 U/L — AB (ref 14–54)
AST: 19 U/L (ref 15–41)
Albumin: 4.2 g/dL (ref 3.5–5.0)
Anion gap: 10 (ref 5–15)
BUN: 6 mg/dL (ref 6–20)
CO2: 20 mmol/L — AB (ref 22–32)
Calcium: 8.6 mg/dL — ABNORMAL LOW (ref 8.9–10.3)
Chloride: 113 mmol/L — ABNORMAL HIGH (ref 101–111)
Creatinine, Ser: 0.81 mg/dL (ref 0.44–1.00)
Glucose, Bld: 67 mg/dL (ref 65–99)
Potassium: 2.9 mmol/L — CL (ref 3.5–5.1)
Sodium: 143 mmol/L (ref 135–145)
Total Bilirubin: 0.3 mg/dL (ref 0.3–1.2)
Total Protein: 6.9 g/dL (ref 6.5–8.1)

## 2014-08-16 LAB — URINALYSIS COMPLETE WITH MICROSCOPIC (ARMC ONLY)
BILIRUBIN URINE: NEGATIVE
Bacteria, UA: NONE SEEN
Glucose, UA: 150 mg/dL — AB
HGB URINE DIPSTICK: NEGATIVE
Ketones, ur: NEGATIVE mg/dL
LEUKOCYTES UA: NEGATIVE
NITRITE: NEGATIVE
Protein, ur: NEGATIVE mg/dL
Specific Gravity, Urine: 1.002 — ABNORMAL LOW (ref 1.005–1.030)
WBC UA: NONE SEEN WBC/hpf (ref 0–5)
pH: 6 (ref 5.0–8.0)

## 2014-08-16 LAB — CBC WITH DIFFERENTIAL/PLATELET
Basophils Absolute: 0 10*3/uL (ref 0–0.1)
Basophils Relative: 1 %
EOS ABS: 0 10*3/uL (ref 0–0.7)
EOS PCT: 0 %
HCT: 37.1 % (ref 35.0–47.0)
Hemoglobin: 12.5 g/dL (ref 12.0–16.0)
Lymphocytes Relative: 21 %
Lymphs Abs: 2 10*3/uL (ref 1.0–3.6)
MCH: 30.2 pg (ref 26.0–34.0)
MCHC: 33.6 g/dL (ref 32.0–36.0)
MCV: 89.9 fL (ref 80.0–100.0)
MONOS PCT: 5 %
Monocytes Absolute: 0.5 10*3/uL (ref 0.2–0.9)
Neutro Abs: 6.9 10*3/uL — ABNORMAL HIGH (ref 1.4–6.5)
Neutrophils Relative %: 73 %
PLATELETS: 306 10*3/uL (ref 150–440)
RBC: 4.13 MIL/uL (ref 3.80–5.20)
RDW: 12.4 % (ref 11.5–14.5)
WBC: 9.5 10*3/uL (ref 3.6–11.0)

## 2014-08-16 LAB — HCG, QUANTITATIVE, PREGNANCY: hCG, Beta Chain, Quant, S: <1 m[IU]/mL (ref <5)

## 2014-08-16 LAB — LIPASE, BLOOD: Lipase: 30 U/L (ref 22–51)

## 2014-08-16 LAB — ACETAMINOPHEN LEVEL: Acetaminophen (Tylenol), Serum: <10 ug/mL — ABNORMAL LOW (ref 10–30)

## 2014-08-16 LAB — SALICYLATE LEVEL: Salicylate Lvl: 5 mg/dL (ref 2.8–30.0)

## 2014-08-16 MED ORDER — SODIUM CHLORIDE 0.9 % IV BOLUS (SEPSIS)
1000.0000 mL | Freq: Once | INTRAVENOUS | Status: AC
  Administered 2014-08-16: 1000 mL via INTRAVENOUS

## 2014-08-16 MED ORDER — LORAZEPAM 2 MG PO TABS
0.0000 mg | ORAL_TABLET | Freq: Two times a day (BID) | ORAL | Status: DC
  Administered 2014-08-16: 1 mg via ORAL

## 2014-08-16 MED ORDER — VITAMIN B-1 100 MG PO TABS: 100.0000 mg | ORAL_TABLET | Freq: Every day | ORAL | Status: DC

## 2014-08-16 MED ORDER — LORAZEPAM 2 MG/ML IJ SOLN: 0.0000 mg | Freq: Four times a day (QID) | INTRAMUSCULAR | Status: DC

## 2014-08-16 MED ORDER — POTASSIUM CHLORIDE 10 MEQ/100ML IV SOLN
10.0000 meq | INTRAVENOUS | Status: DC
  Administered 2014-08-16: 10 meq via INTRAVENOUS
  Filled 2014-08-16 (×5): qty 100

## 2014-08-16 MED ORDER — THIAMINE HCL 100 MG/ML IJ SOLN: 100.0000 mg | Freq: Every day | INTRAMUSCULAR | Status: DC

## 2014-08-16 MED ORDER — LORAZEPAM 2 MG PO TABS: 0.0000 mg | ORAL_TABLET | Freq: Four times a day (QID) | ORAL | Status: DC

## 2014-08-16 MED ORDER — LORAZEPAM 2 MG/ML IJ SOLN
1.0000 mg | Freq: Once | INTRAMUSCULAR | Status: AC
  Administered 2014-08-16: 1 mg via INTRAVENOUS

## 2014-08-16 MED ORDER — LORAZEPAM 1 MG PO TABS
ORAL_TABLET | ORAL | Status: AC
  Filled 2014-08-16: qty 1

## 2014-08-16 MED ORDER — LORAZEPAM 2 MG/ML IJ SOLN: 0.0000 mg | Freq: Two times a day (BID) | INTRAMUSCULAR | Status: DC

## 2014-08-16 MED ORDER — LORAZEPAM 2 MG/ML IJ SOLN
INTRAMUSCULAR | Status: AC
  Administered 2014-08-16: 1 mg via INTRAVENOUS
  Filled 2014-08-16: qty 1

## 2014-08-16 MED ORDER — POTASSIUM CHLORIDE CRYS ER 20 MEQ PO TBCR
EXTENDED_RELEASE_TABLET | ORAL | Status: AC
  Filled 2014-08-16: qty 2

## 2014-08-16 MED ORDER — POTASSIUM CHLORIDE CRYS ER 20 MEQ PO TBCR: 40.0000 meq | EXTENDED_RELEASE_TABLET | Freq: Once | ORAL | Status: DC

## 2014-08-16 MED ORDER — POTASSIUM CHLORIDE CRYS ER 20 MEQ PO TBCR
40.0000 meq | EXTENDED_RELEASE_TABLET | Freq: Once | ORAL | Status: AC
  Administered 2014-08-16: 40 meq via ORAL

## 2014-08-16 NOTE — ED Provider Notes (Signed)
**Note Andrea-Identified via Obfuscation** [facility] Emergency Department Provider Note  ____________________________________________  Time seen: On arrival to the emergency department  I have reviewed the triage vital signs and the nursing notes.   HISTORY  Chief Complaint Seizures    HPI Andrea Gamble is a 29 y.o. female with a history of ADHD who presents today after polysubstance ingestion. Unknown time that the patient ingested substances. Admitted to 1 can of 4 Loco to EMS. And then nodded yes that she had used other substances. However, no other substances were exquisitely admitted to an bystanders at the scene did not give any further information. The medics suspected some seizure activity because the patient was found to ground foaming at the mouth. Again, bystanders were not able to give a history of how long any seizure lasted 4 or the character of the seizure. Unknown if patient fell. At this time the patient has an altered mental status and is unable to give any further history.  ----------------------------------------- 8:54 PM on 08/16/2014 -----------------------------------------  Patient's fianc now the bedside. Says that the patient just had the can of 4 Loco and Budweiser. Do not ingest any other substances. Said she then had about a 2 to three-minute interval where she went unresponsive and then woke up and distress and with alteration of mental status. There was no trauma no seizure activity observed. Patient does have a history of seizures which the patient's fianc says she has been on Xanax for the past. Says that she does take occasional benzos and took some Valium yesterday. However, is not a regular user. Only drinks intermittently.    Past Medical History  Diagnosis Date  . ADHD (attention deficit hyperactivity disorder)     There are no active problems to display for this patient.   Past Surgical History  Procedure Laterality Date  . Back surgery      No current  outpatient prescriptions on file.  Allergies Review of patient's allergies indicates not on file.  History reviewed. No pertinent family history.  Social History History  Substance Use Topics  . Smoking status: Current Every Day Smoker  . Smokeless tobacco: Never Used  . Alcohol Use: 0.6 oz/week    1 Standard drinks or equivalent per week    Review of Systems Constitutional: No fever/chills Eyes: No visual changes. ENT: No sore throat. Cardiovascular: Denies chest pain. Respiratory: Denies shortness of breath. Gastrointestinal: No abdominal pain.  No nausea, no vomiting.  No diarrhea.  No constipation. Genitourinary: Negative for dysuria. Musculoskeletal: Negative for back pain. Skin: Negative for rash. Neurological: Negative for headaches, focal weakness or numbness.  10-point ROS otherwise negative.  ____________________________________________   PHYSICAL EXAM:  VITAL SIGNS: ED Triage Vitals  Enc Vitals Group     BP 08/16/14 2039 116/67 mmHg     Pulse Rate 08/16/14 2039 90     Resp --      Temp 08/16/14 2039 97.8 F (36.6 C)     Temp Source 08/16/14 2039 Oral     SpO2 08/16/14 2039 100 %     Weight 08/16/14 2039 120 lb (54.432 kg)     Height 08/16/14 2039 4\' 9"  (1.448 m)     Head Cir --      Peak Flow --      Pain Score --      Pain Loc --      Pain Edu? --      Excl. in GC? --     Constitutional: Rolling around bed and  moaning.    Eyes: Conjunctivae are normal. PERRL. EOMI. Head: Atraumatic. Nose: No congestion/rhinnorhea. Mouth/Throat: Mucous membranes are moist.  Oropharynx non-erythematous. Neck: No stridor.   Cardiovascular: Normal rate, regular rhythm. Grossly normal heart sounds.  Good peripheral circulation. Respiratory: Normal respiratory effort.  No retractions. Lungs CTAB. Gastrointestinal: Soft. No distention. No abdominal bruits. No CVA tenderness. Musculoskeletal: No lower extremity tenderness nor edema.  No joint effusions. Neurologic:   Moves all 4 extremities equally. Patient is alert and oriented 0. Unclear if postictal or from intoxication.  Skin:  Skin is warm, dry and intact. No rash noted. Psychiatric: Agitated.  ____________________________________________   LABS (all labs ordered are listed, but only abnormal results are displayed)  Labs Reviewed  CBC WITH DIFFERENTIAL/PLATELET - Abnormal; Notable for the following:    Neutro Abs 6.9 (*)    All other components within normal limits  COMPREHENSIVE METABOLIC PANEL - Abnormal; Notable for the following:    Potassium 2.9 (*)    Chloride 113 (*)    CO2 20 (*)    Calcium 8.6 (*)    ALT 8 (*)    Alkaline Phosphatase 34 (*)    All other components within normal limits  URINALYSIS COMPLETEWITH MICROSCOPIC ([facility])  - Abnormal; Notable for the following:    Color, Urine COLORLESS (*)    APPearance CLEAR (*)    Glucose, UA 150 (*)    Specific Gravity, Urine 1.002 (*)    Squamous Epithelial / LPF 0-5 (*)    All other components within normal limits  URINE DRUG SCREEN, QUALITATIVE ([facility]) - Abnormal; Notable for the following:    Cocaine Metabolite,Ur [place] POSITIVE (*)    Benzodiazepine, Ur Scrn POSITIVE (*)    All other components within normal limits  LIPASE, BLOOD  ETHANOL  HCG, QUANTITATIVE, PREGNANCY  ACETAMINOPHEN LEVEL  SALICYLATE LEVEL  POC URINE PREG, ED   ____________________________________________  EKG  ED ECG REPORT I, Arelia Longest, the attending physician, personally viewed and interpreted this ECG.   Date: 08/16/2014  EKG Time: 2025  Rate: 100  Rhythm: normal EKG, normal sinus rhythm  Axis: Normal axis  Intervals:none  ST&T Change: No ST elevation or depressions no STEMI. No T-wave inversions.  ____________________________________________  RADIOLOGY  Pending ____________________________________________   PROCEDURES    ____________________________________________   INITIAL IMPRESSION / ASSESSMENT AND PLAN / ED  COURSE  Pertinent labs & imaging results that were available during my care of the patient were reviewed by me and considered in my medical decision making (see chart for details).  ----------------------------------------- 10:01 PM on 08/16/2014 -----------------------------------------  Patient more calm after 1 mg IV Ativan. Focuses on me when I call her name. However, still not answering questions. Did tell her fianc that she also took a "pink pill from someone at the store."  Patient is also saying now that she is seeing things and hallucinating. Unclear preceding events. We will continue with imaging. We'll replete potassium. We'll observe in the emergency department for sobriety. Likely substance-induced altered mental status. Unclear seizure history. No history of antiepileptic medications.  ----------------------------------------- 10:05 PM on 08/16/2014 -----------------------------------------  Patient awake and answering questions. Says his only sees in the past one stopped taking benzos. Says takes Xanax and Valium daily. Last use was yesterday. Dr. Juliette Alcide to follow patient to the sobriety. Today spoke accordingly after labs and imaging complete and patient reassess. ____________________________________________   FINAL CLINICAL IMPRESSION(S) / ED DIAGNOSES  Acute polysubstance abuse. Acute intoxication.    Myra Rude  Schaevitz, MD 08/16/14 2207

## 2014-08-16 NOTE — ED Notes (Signed)
Pt via ems; EMS states she had seizure-like activity, with mouth foaming but no jerking-type movement. Pt is moaning and terarful but will not respond to questions about what she took. Pt grabbing at abdomen and genital area and moaning/writhing. Pupils are 4 mm with some reaction to light.

## 2014-08-16 NOTE — ED Notes (Signed)
POCT pregnancy negative

## 2014-08-16 NOTE — ED Provider Notes (Signed)
Patient's lab work has come back if potassium is 2.6 patient is getting an IV potassium drip but complains that it is burning her too much she is now awake alert and oriented 3 and able to tolerate by mouth I will changes to potassium 40 mEq by mouth now and 40 mEq by mouth again in a couple hours patient's CT of the head and neck is back and does not show any acute disease chest x-ray and pelvis are also not showing any acute disease patient says she was drinking and doing drugs because of her "mental issues" and nobody will address them so I asked her if she wants to try to address them she said yes Claris CheMargaret will come and see her in consult I will sign out this patient to the oncoming shift  Arnaldo NatalPaul F Josean Lycan, MD 08/16/14 2324
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
## 2014-08-16 NOTE — ED Notes (Signed)
Pt reports hallucinations are gone and she feels "great now". Family at bedside, no needs identified at this time

## 2014-08-16 NOTE — ED Notes (Signed)
Pt used bedpan for 3rd time since arriving; each time it has been almost completely full

## 2014-08-16 NOTE — ED Notes (Signed)
Pt via ems from home after suffering seizure-like activity; foaming at mouth but no jerking-type behavior; fiance states she had 4 loco, which is kool-aid type drink with alcohol. She also drank a budweiser  She got quiet and non-responsive, and fiance called ems. She began screeching and he held her head to keep her safe.

## 2014-08-17 LAB — POCT PREGNANCY, URINE: Preg Test, Ur: NEGATIVE

## 2014-08-17 MED ORDER — POTASSIUM CHLORIDE CRYS ER 20 MEQ PO TBCR
EXTENDED_RELEASE_TABLET | ORAL | Status: AC
  Filled 2014-08-17: qty 1

## 2014-08-17 MED ORDER — POTASSIUM CHLORIDE CRYS ER 20 MEQ PO TBCR
20.0000 meq | EXTENDED_RELEASE_TABLET | Freq: Once | ORAL | Status: AC
  Administered 2014-08-17: 20 meq via ORAL

## 2014-08-17 NOTE — ED Provider Notes (Signed)
-----------------------------------------   3:06 AM on 08/17/2014 -----------------------------------------  Behavioral medicine RN Claris CheMargaret spoke with patient; [facility]. Improved and ready for discharge. Strict return precautions given. Patient and family member verbalized understanding and agree with plan of care.  Irean HongJade J Sung, MD 08/17/14 [phone]
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
## 2014-08-17 NOTE — ED Notes (Signed)
Pt in bed with eyes closed, family at bedside, vss, no distress noted at this time

## 2014-08-17 NOTE — Discharge Instructions (Signed)
1. Drink alcohol only in moderation. 2. Avoid use of illicit/recreational drugs. 3. Return to the ER for worsening symptoms, persistent vomiting, difficulty breathing or other concerns.   Alcohol Intoxication Alcohol intoxication occurs when the amount of alcohol that a person has consumed impairs his or her ability to mentally and physically function. Alcohol directly impairs the normal chemical activity of the brain. Drinking large amounts of alcohol can lead to changes in mental function and behavior, and it can cause many physical effects that can be harmful.  Alcohol intoxication can range in severity from mild to very severe. Various factors can affect the level of intoxication that occurs, such as the person's age, gender, weight, frequency of alcohol consumption, and the presence of other medical conditions (such as diabetes, seizures, or heart conditions). Dangerous levels of alcohol intoxication may occur when people drink large amounts of alcohol in a short period (binge drinking). Alcohol can also be especially dangerous when combined with certain prescription medicines or "recreational" drugs. SIGNS AND SYMPTOMS Some common signs and symptoms of mild alcohol intoxication include:  Loss of coordination.  Changes in mood and behavior.  Impaired judgment.  Slurred speech. As alcohol intoxication progresses to more severe levels, other signs and symptoms will appear. These may include:  Vomiting.  Confusion and impaired memory.  Slowed breathing.  Seizures.  Loss of consciousness. DIAGNOSIS  Your health care provider will take a medical history and perform a physical exam. You will be asked about the amount and type of alcohol you have consumed. Blood tests will be done to measure the concentration of alcohol in your blood. In many places, your blood alcohol level must be lower than 80 mg/dL (8.11%0.08%) to legally drive. However, many dangerous effects of alcohol  can occur at much lower levels.  TREATMENT  People with alcohol intoxication often do not require treatment. Most of the effects of alcohol intoxication are temporary, and they go away as the alcohol naturally leaves the body. Your health care provider will monitor your condition until you are stable enough to go home. Fluids are sometimes given through an IV access tube to help prevent dehydration.  HOME CARE INSTRUCTIONS  Do not drive after drinking alcohol.  Stay hydrated. Drink enough water and fluids to keep your urine clear or pale yellow. Avoid caffeine.   Only take over-the-counter or prescription medicines as directed by your health care provider.  SEEK MEDICAL CARE IF:   You have persistent vomiting.   You do not feel better after a few days.  You have frequent alcohol intoxication. Your health care provider can help determine if you should see a substance use treatment counselor. SEEK IMMEDIATE MEDICAL CARE IF:   You become shaky or tremble when you try to stop drinking.   You shake uncontrollably (seizure).   You throw up (vomit) blood. This may be bright red or may look like black coffee grounds.   You have blood in your stool. This may be bright red or may appear as a black, tarry, bad smelling stool.   You become lightheaded or faint.  MAKE SURE YOU:   Understand these instructions.  Will watch your condition.  Will get help right away if you are not doing well or get worse. Document Released: 12/24/2004 Document Revised: 11/16/2012 Document Reviewed: 08/19/2012 [facility] Patient Information 2015 [place], [place]. This information is not intended to replace advice given to you by your health care provider. Make sure you discuss  any questions you have with your health care provider.  Cocaine Cocaine stimulates the central nervous system. As a stimulant, cocaine has the ability to improve athletic performance through increasing speed, endurance, and  concentration, as well as decreasing fatigue. Although cocaine may seem to be beneficial for athletics, it is highly addicting and has many debilitating side effects. Cocaine has caused the deaths of many athletes, and its use is banned by every major athletic organization in the world. The clinical effect of cocaine (the high) is very short in duration. Cocaine works in the brain by altering the normal concentrations of chemicals that stimulate the brain cells.  WHY ATHLETES USE IT  Many athletes use cocaine for its central nervous system stimulating properties. It is also used as a recreational drug due to the euphoric felling it produces.  ADVERSE EFFECTS   Sleep disturbances.  Abnormal heart rhythms.  Stroke.  Heart attack.  Seizures.  Elevated blood pressure.  Death.  Paranoia (feeling that people want to hurt you).  Panic attacks (sudden feelings of anxiety or shortness of breath).  Suicidal behavior (wanting to kill yourself).  Homicidal behavior (wanting to kill other people).  Depression (feeling very sad, having decreased energy for activities).  Poor athletic performance. PHARMACOLOGY  Cocaine acts on the body for a short period of time; the clinical effects may last less than1 hour. Since most athletic competitions last for more than 1 hour, cocaine use may not improve athletic performance. The use of cocaine makes individuals much more susceptible for serious conditions such as seizures, arrhythmia (irregular heart beat), and strokes. Even a single dose of cocaine can be detected on a drug test for up to about 30 hours.  PREVENTION Most athletes use cocaine as a recreational drug and not for the purpose of enhancing athletic performance. To prevent the use of cocaine, athletes must be educated on its side effects and the risk of addiction. If an athlete is found using cocaine, counseling and treatment are almost always required.  Document Released: 03/16/2005 Document  Revised: 06/08/2011 Document Reviewed: 06/28/2008 [facility] Patient Information 2015 [place], [place]. This information is not intended to replace advice given to you by your health care provider. Make sure you discuss any questions you have with your health care provider.  Chemical Dependency Chemical dependency is an addiction to drugs or alcohol. It is characterized by the repeated behavior of seeking out and using drugs and alcohol despite harmful consequences to the health and safety of ones self and others.  RISK FACTORS There are certain situations or behaviors that increase a person's risk for chemical dependency. These include:  A family history of chemical dependency.  A history of mental health issues, including depression and anxiety.  A home environment where drugs and alcohol are easily available to you.  Drug or alcohol use at a young age. SYMPTOMS  The following symptoms can indicate chemical dependency:  Inability to limit the use of drugs or alcohol.  Nausea, sweating, shakiness, and anxiety that occurs when alcohol or drugs are not being used.  An increase in amount of drugs or alcohol that is necessary to get drunk or high. People who experience these symptoms can assess their use of drugs and alcohol by asking themselves the following questions:  Have you been told by friends or family that they are worried about your use of alcohol or drugs?  Do friends and family ever tell you about things you did while drinking alcohol or using drugs that you  do not remember?  Do you lie about using alcohol or drugs or about the amounts you use?  Do you have difficulty completing daily tasks unless you use alcohol or drugs?  Is the level of your work or school performance lower because of your drug or alcohol use?  Do you get sick from using drugs or alcohol but keep using anyway?  Do you feel uncomfortable in social situations unless you use alcohol or drugs?  Do you use  drugs or alcohol to help forget problems? An answer of yes to any of these questions may indicate chemical dependency. Professional evaluation is suggested. Document Released: 03/10/2001 Document Revised: 06/08/2011 Document Reviewed: 05/22/2010 [facility] Patient Information 2015 [place], [place]. This information is not intended to replace advice given to you by your health care provider. Make sure you discuss any questions you have with your health care provider.  Drug Abuse and Addiction in Sports There are many types of drugs that one may become addicted to including illegal drugs (marijuana, cocaine, amphetamines, hallucinogens, and narcotics), prescription drugs (hydrocodone, codeine, and alprazolam), and other chemicals such as alcohol or nicotine. Two types of addiction exist: physical and emotional. Physical addiction usually occurs after prolonged use of a drug. However, some drugs may only take a couple uses before addiction can occur. Physical addiction is marked by withdrawal symptoms in which the person experiences negative symptoms such as sweat, anxiety, tremors, hallucinations, or cravings in the absence of using the drug. Emotional dependence is the psychological desire for the "high" that the drugs produce when taken. SYMPTOMS   Inattentiveness.  Negligence.  Forgetfulness.  Insomnia.  Mood swings. RISK INCREASES WITH:   Family history of addiction.  Personal history of addictive personality. Studies have shown that risk takers, which many athletes are, have a higher risk of addiction. PREVENTION The only adequate prevention of drug abuse is abstinence from drugs. TREATMENT  The first step in quitting substance abuse is recognizing the problem and realizing that one has the power to change. Quitting requires a plan and support from others. It is often necessary to seek medical assistance. Caregivers are available to offer counseling, and for certain cases, medicine to  diminish the physical symptoms of withdrawal. Many organizations exist such as Alcoholics Anonymous, Narcotics Anonymous, or the [employer] on Alcoholism that offer support for individuals who have chosen to quit their habits. Document Released: 03/16/2005 Document Revised: 07/31/2013 Document Reviewed: 06/28/2008 [facility] Patient Information 2015 [place], [place]. This information is not intended to replace advice given to you by your health care provider. Make sure you discuss any questions you have with your health care provider.

## 2015-11-16 ENCOUNTER — Emergency Department
Admission: EM | Admit: 2015-11-16 | Discharge: 2015-11-16 | Disposition: A | Discharge status: Home / Self Care | Payer: Self-pay | Attending: Emergency Medicine | Admitting: Emergency Medicine

## 2015-11-16 ENCOUNTER — Encounter: Payer: Self-pay | Admitting: Emergency Medicine

## 2015-11-16 DIAGNOSIS — F17210 Nicotine dependence, cigarettes, uncomplicated: Secondary | ICD-10-CM | POA: Insufficient documentation

## 2015-11-16 DIAGNOSIS — K0889 Other specified disorders of teeth and supporting structures: Secondary | ICD-10-CM

## 2015-11-16 DIAGNOSIS — K047 Periapical abscess without sinus: Secondary | ICD-10-CM | POA: Insufficient documentation

## 2015-11-16 DIAGNOSIS — F909 Attention-deficit hyperactivity disorder, unspecified type: Secondary | ICD-10-CM | POA: Insufficient documentation

## 2015-11-16 HISTORY — DX: Cardiac murmur, unspecified: R01.1

## 2015-11-16 MED ORDER — OXYCODONE-ACETAMINOPHEN 5-325 MG PO TABS: 1.0000 | ORAL_TABLET | ORAL | 0 refills | Status: DC | PRN

## 2015-11-16 MED ORDER — AZITHROMYCIN 250 MG PO TABS: ORAL_TABLET | ORAL | 0 refills | Status: DC

## 2015-11-16 MED ORDER — MELOXICAM 15 MG PO TABS: 15.0000 mg | ORAL_TABLET | Freq: Every day | ORAL | 0 refills | Status: DC

## 2015-11-16 MED ORDER — IBUPROFEN 800 MG PO TABS
800.0000 mg | ORAL_TABLET | Freq: Once | ORAL | Status: AC
  Administered 2015-11-16: 800 mg via ORAL
  Filled 2015-11-16: qty 1

## 2015-11-16 MED ORDER — LIDOCAINE VISCOUS 2 % MT SOLN
20.0000 mL | OROMUCOSAL | 0 refills | Status: DC | PRN
Start: 2015-11-16 — End: 2016-03-04

## 2015-11-16 MED ORDER — OXYCODONE-ACETAMINOPHEN 5-325 MG PO TABS
1.0000 | ORAL_TABLET | Freq: Once | ORAL | Status: AC
  Administered 2015-11-16: 1 via ORAL
  Filled 2015-11-16: qty 1

## 2015-11-16 NOTE — Discharge Instructions (Signed)
OPTIONS FOR DENTAL FOLLOW UP CARE ° °[place] Department of Health and Human Services - Local Safety Net Dental Clinics °http://www.ncdhhs.gov/dph/oralhealth/services/safetynetclinics.htm °  °[facility] (336-562-3123) ° °[facility] [place] (919-933-9087) ° °[facility] (919-663-1744 ext [phone]) ° °[facility] (336-570-6415) ° °[facility] (919-968-2025) °This clinic caters to the indigent population and is on a lottery system. °Location: °[facility], [address], [place] °Clinic Hours: °Wednesdays from 6pm - 9pm, patients seen by a lottery system. °For dates, call or go to www.med.[facility].edu/[facility]/patients/Dental-[facility] °Services: °Cleanings, fillings and simple extractions. °Payment Options: °DENTAL WORK IS FREE OF CHARGE. Bring proof of income or support. °Best way to get seen: °Arrive at 5:15 pm - this is a lottery, NOT first come/first serve, so arriving earlier will not increase your chances of being seen. °  °  °[facility] °919-537-3737 °Select option 1 for emergencies °  °Location: °[facility], [address], [place] °Clinic Hours: °No walk-ins accepted - call the day before to schedule an appointment. °Check in times are 9:30 am and 1:30 pm. °Services: °Simple extractions, temporary fillings, pulpectomy/pulp debridement, uncomplicated abscess drainage. °Payment Options: °PAYMENT IS DUE AT THE TIME OF SERVICE.  Fee is usually $100-200, additional surgical procedures (e.g. abscess drainage) may be extra. °Cash, checks, Visa/MasterCard accepted.  Can file Medicaid if patient is covered for dental - patient should call case worker to check. °No discount for [facility] patients. °Best way to get seen: °MUST call the day before and get onto the schedule. Can usually be seen the next 1-2 days. No walk-ins accepted. °  °  °[facility] °[phone] °   °Location: °[facility], [address], [place] °Clinic Hours: °M, W, Th, F 8am or 1:30pm, Tues 9a or 1:30 - first come/first served. °Services: °Simple extractions, temporary fillings, uncomplicated abscess drainage.  You do not need to be an [place] resident. °Payment Options: °PAYMENT IS DUE AT THE TIME OF SERVICE. °Dental insurance, otherwise sliding scale - bring proof of income or support. °Depending on income and treatment needed, cost is usually $50-200. °Best way to get seen: °Arrive early as it is first come/first served. °  °  °[facility] °[phone] °  °Location: °[address] °Clinic Hours: °Mon-Thu 8a-5p °Services: °Most basic dental services including extractions and fillings. °Payment Options: °PAYMENT IS DUE AT THE TIME OF SERVICE. °Sliding scale, up to 50% off - bring proof if income or support. °Medicaid with dental option accepted. °Best way to get seen: °Call to schedule an appointment, can usually be seen within 2 weeks OR they will try to see walk-ins - show up at 8a or 2p (you may have to wait). °  °  °[facility] °919-245-2435 °[place] RESIDENTS ONLY °  °Location: °[facility], [address], [place], [place] [address] °Clinic Hours: By appointment only. °Monday - Thursday 8am-5pm, Friday 8am-12pm °Services: Cleanings, fillings, extractions. °Payment Options: °PAYMENT IS DUE AT THE TIME OF SERVICE. °Cash, Visa or MasterCard. Sliding scale - $30 minimum per service. °Best way to get seen: °Come in to office, complete packet and make an appointment - need proof of income °or support monies for each household member and proof of [place] residence. °Usually takes about a month to get in. °  °  °[facility] °[phone] °  °Location: °[address]   [place] °Clinic Hours: Walk-in Urgent Care Dental Services are offered Monday-Friday  mornings only. °The numbers of emergencies accepted daily is limited to the number of °providers available. °Maximum 15 - Mondays, Wednesdays & Thursdays °Maximum 10 - Tuesdays & Fridays °Services: °You do not need to be a [place] resident to be seen for a dental emergency. °Emergencies are defined as pain, swelling, abnormal bleeding, or dental trauma. Walkins will receive x-rays if needed. °NOTE: Dental cleaning is not an emergency. °Payment Options: °PAYMENT IS DUE AT THE TIME OF SERVICE. °Minimum co-pay is $40.00 for uninsured patients. °Minimum co-pay is $3.00 for Medicaid with dental coverage. °Dental Insurance is accepted and must be presented at time of visit. °Medicare does not cover dental. °Forms of payment: Cash, credit card, checks. °Best way to get seen: °If not previously registered with the clinic, walk-in dental registration begins at 7:15 am and is on a first come/first serve basis. °If previously registered with the clinic, call to make an appointment. °  °  °[facility] °919-776-4359 °[place] RESIDENTS ONLY °  °Location: °[address], [place], [place] °Clinic Hours: °Mon-Thu 10a-2p °Services: Extractions only! °Payment Options: °FREE (donations accepted) - bring proof of income or support °Best way to get seen: °Call and schedule an appointment OR come at 8am on the 1st Monday of every month (except for holidays) when it is first come/first served. °  °  °[facility] °[phone] °  °Location: °[address], [place] °Clinic Hours: °Friday mornings °Services, Payment Options, Best way to get seen: °Call for info °

## 2015-11-16 NOTE — ED Provider Notes (Signed)
**Note Andrea-Identified via Obfuscation** [facility] Provider Note  ____________________________________________  Time seen: Approximately 10:28 AM  I have reviewed the triage vital signs and the nursing notes.   HISTORY  Chief Complaint Dental Pain    HPI Andrea Gamble is a 30 y.o. female who presents for evaluation of dental pain. Patient states her face is swollen difficulty eating in extreme pain 10 over 10. She denies any trauma. She will be going to see a dentist as soon as possible.   Past Medical History:  Diagnosis Date  . ADHD (attention deficit hyperactivity disorder)   . Heart murmur     There are no active problems to display for this patient.   Past Surgical History:  Procedure Laterality Date  . BACK SURGERY      Prior to Admission medications   Medication Sig Start Date End Date Taking? Authorizing Provider  azithromycin (ZITHROMAX Z-PAK) 250 MG tablet Take 2 tablets (500 mg) on  Day 1,  followed by 1 tablet (250 mg) once daily on Days 2 through 5. 11/16/15   Charmayne Sheerharles M Darriel Sinquefield, PA-C  lidocaine (XYLOCAINE) 2 % solution Use as directed 20 mLs in the mouth or throat as needed for mouth pain. 11/16/15   Evangeline Dakinharles M Dayzee Trower, PA-C  meloxicam (MOBIC) 15 MG tablet Take 1 tablet (15 mg total) by mouth daily. 11/16/15   Evangeline Dakinharles M Montoya Watkin, PA-C  oxyCODONE-acetaminophen (ROXICET) 5-325 MG tablet Take 1-2 tablets by mouth every 4 (four) hours as needed for severe pain. 11/16/15   Evangeline Dakinharles M Doretta Remmert, PA-C    Allergies Keflex [cephalexin]; Penicillins; and Sulfur  No family history on file.  Social History Social History  Substance Use Topics  . Smoking status: Current Every Day Smoker    Packs/day: 0.50    Types: Cigarettes  . Smokeless tobacco: Never Used  . Alcohol use 0.6 oz/week    1 Standard drinks or equivalent per week    Review of Systems Constitutional: No fever/chills Eyes: No visual changes. ENT: Obvious dental caries positive fractured teeth. Positive  swelling of his left upper gums. Cardiovascular: Denies chest pain. Respiratory: Denies shortness of breath. Musculoskeletal: Negative for back pain. Skin: Negative for rash. Neurological: Negative for headaches, focal weakness or numbness.  10-point ROS otherwise negative.  ____________________________________________   PHYSICAL EXAM:  VITAL SIGNS: ED Triage Vitals  Enc Vitals Group     BP 11/16/15 0947 121/79     Pulse Rate 11/16/15 0947 81     Resp 11/16/15 0947 18     Temp 11/16/15 0947 99 F (37.2 C)     Temp Source 11/16/15 0947 Oral     SpO2 11/16/15 0947 100 %     Weight 11/16/15 0949 136 lb (61.7 kg)     Height 11/16/15 0949 4\' 11"  (1.499 m)     Head Circumference --      Peak Flow --      Pain Score 11/16/15 0949 10     Pain Loc --      Pain Edu? --      Excl. in GC? --     Constitutional: Alert and oriented. Well appearing and in no acute distress. Head: Atraumatic. Nose: No congestion/rhinnorhea. Mouth/Throat: Mucous membranes are moist.  Oropharynx non-erythematous.Obvious dental caries noted with fractured teeth left upper gums worse than right. Neck: No stridor.  Supple full range of motion of cervical adenopathy noted. Cardiovascular: Normal rate, regular rhythm. Grossly normal heart sounds.  Good peripheral circulation. Respiratory: Normal respiratory effort.  No retractions. Lungs CTAB. Neurologic:  Normal speech and language. No gross focal neurologic deficits are appreciated.  Skin:  Skin is warm, dry and intact. No rash noted. Psychiatric: Mood and affect are normal. Speech and behavior are normal.  ____________________________________________   LABS (all labs ordered are listed, but only abnormal results are displayed)  Labs Reviewed - No data to  display ____________________________________________  EKG   ____________________________________________  RADIOLOGY   ____________________________________________   PROCEDURES  Procedure(s) performed: None  Critical Care performed: No  ____________________________________________   INITIAL IMPRESSION / ASSESSMENT AND PLAN / ED COURSE  Pertinent labs & imaging results that were available during my care of the patient were reviewed by me and considered in my medical decision making (see chart for details). Review of the [place] CSRS was performed in accordance of the [facility] prior to dispensing any controlled drugs.  Dental caries. Rx given for Zithromax, Percocet, meloxicam and viscous lidocaine. Patient follow-up with attached dental providers as listed. She voices no other emergency medical complaints at this time  Clinical Course    ____________________________________________   FINAL CLINICAL IMPRESSION(S) / ED DIAGNOSES  Final diagnoses:  Pain, dental  Dental infection     This chart was dictated using voice recognition software/Dragon. Despite best efforts to proofread, errors can occur which can change the meaning. Any change was purely unintentional.    Evangeline Dakinharles M Oswaldo Cueto, PA-C 11/16/15 1101    Arnaldo NatalPaul F Malinda, MD 11/21/15 [phone]

## 2015-11-16 NOTE — ED Triage Notes (Signed)
Upper L dental pain x 1 day

## 2015-11-30 ENCOUNTER — Encounter: Payer: Self-pay | Admitting: Emergency Medicine

## 2015-11-30 ENCOUNTER — Emergency Department
Admission: EM | Admit: 2015-11-30 | Discharge: 2015-11-30 | Disposition: A | Discharge status: Home / Self Care | Payer: Self-pay | Attending: Emergency Medicine | Admitting: Emergency Medicine

## 2015-11-30 ENCOUNTER — Emergency Department: Payer: Self-pay

## 2015-11-30 DIAGNOSIS — N938 Other specified abnormal uterine and vaginal bleeding: Secondary | ICD-10-CM | POA: Insufficient documentation

## 2015-11-30 DIAGNOSIS — R102 Pelvic and perineal pain: Secondary | ICD-10-CM

## 2015-11-30 DIAGNOSIS — N946 Dysmenorrhea, unspecified: Secondary | ICD-10-CM | POA: Insufficient documentation

## 2015-11-30 DIAGNOSIS — Z79899 Other long term (current) drug therapy: Secondary | ICD-10-CM | POA: Insufficient documentation

## 2015-11-30 DIAGNOSIS — F17210 Nicotine dependence, cigarettes, uncomplicated: Secondary | ICD-10-CM | POA: Insufficient documentation

## 2015-11-30 DIAGNOSIS — F909 Attention-deficit hyperactivity disorder, unspecified type: Secondary | ICD-10-CM | POA: Insufficient documentation

## 2015-11-30 LAB — CBC WITH DIFFERENTIAL/PLATELET
BASOS ABS: 0.1 10*3/uL (ref 0–0.1)
Basophils Relative: 1 %
EOS PCT: 4 %
Eosinophils Absolute: 0.4 10*3/uL (ref 0–0.7)
HEMATOCRIT: 35 % (ref 35.0–47.0)
HEMOGLOBIN: 12.3 g/dL (ref 12.0–16.0)
LYMPHS ABS: 2.9 10*3/uL (ref 1.0–3.6)
LYMPHS PCT: 33 %
MCH: 31 pg (ref 26.0–34.0)
MCHC: 35.2 g/dL (ref 32.0–36.0)
MCV: 88 fL (ref 80.0–100.0)
Monocytes Absolute: 0.6 10*3/uL (ref 0.2–0.9)
Monocytes Relative: 7 %
NEUTROS ABS: 4.9 10*3/uL (ref 1.4–6.5)
Neutrophils Relative %: 55 %
Platelets: 341 10*3/uL (ref 150–440)
RBC: 3.97 MIL/uL (ref 3.80–5.20)
RDW: 14 % (ref 11.5–14.5)
WBC: 9 10*3/uL (ref 3.6–11.0)

## 2015-11-30 LAB — CHLAMYDIA/NGC RT PCR (ARMC ONLY)
Chlamydia Tr: NOT DETECTED
N gonorrhoeae: NOT DETECTED

## 2015-11-30 LAB — COMPREHENSIVE METABOLIC PANEL
ALT: 8 U/L — AB (ref 14–54)
AST: 15 U/L (ref 15–41)
Albumin: 3.9 g/dL (ref 3.5–5.0)
Alkaline Phosphatase: 42 U/L (ref 38–126)
Anion gap: 6 (ref 5–15)
BILIRUBIN TOTAL: 0.4 mg/dL (ref 0.3–1.2)
BUN: 6 mg/dL (ref 6–20)
CHLORIDE: 109 mmol/L (ref 101–111)
CO2: 24 mmol/L (ref 22–32)
CREATININE: 0.78 mg/dL (ref 0.44–1.00)
Calcium: 8.9 mg/dL (ref 8.9–10.3)
Glucose, Bld: 107 mg/dL — ABNORMAL HIGH (ref 65–99)
Potassium: 3.9 mmol/L (ref 3.5–5.1)
Sodium: 139 mmol/L (ref 135–145)
TOTAL PROTEIN: 7 g/dL (ref 6.5–8.1)

## 2015-11-30 LAB — WET PREP, GENITAL
Clue Cells Wet Prep HPF POC: NONE SEEN
SPERM: NONE SEEN
TRICH WET PREP: NONE SEEN
YEAST WET PREP: NONE SEEN

## 2015-11-30 LAB — HCG, QUANTITATIVE, PREGNANCY: HCG, BETA CHAIN, QUANT, S: 1 m[IU]/mL (ref <5)

## 2015-11-30 MED ORDER — OXYCODONE-ACETAMINOPHEN 5-325 MG PO TABS
1.0000 | ORAL_TABLET | Freq: Once | ORAL | Status: AC
  Administered 2015-11-30: 1 via ORAL
  Filled 2015-11-30: qty 1

## 2015-11-30 MED ORDER — MEDROXYPROGESTERONE ACETATE 10 MG PO TABS: 10.0000 mg | ORAL_TABLET | Freq: Every day | ORAL | 0 refills | Status: AC

## 2015-11-30 NOTE — ED Provider Notes (Signed)
[facility] Emergency Department Provider Note   ____________________________________________   First MD Initiated Contact with Patient 11/30/15 1543     (approximate)  I have reviewed the triage vital signs and the nursing notes.   HISTORY  Chief Complaint Vaginal Bleeding and Abdominal Pain    HPI Andrea Gamble is a 30 y.o. female with a history of ADHD who is presenting with vaginal bleeding that started this morning. She said that she woke up with abdominal cramping and passed a large clot from the vagina. Says it is about the size of a golf ball. Since then she says she has been changing a pad every half hour to hour and the pads have been full. Says that the cramping in her abdomen is lower abdomen and is coming and going. Denies any vaginal discharge. Says that she has been sexually active. Doesn't a history of PID. Gave birth about 5 months ago and has only had 1 period since. She is not on any birth control at this time. Denies any nausea vomiting or diarrhea.   Past Medical History:  Diagnosis Date  . ADHD (attention deficit hyperactivity disorder)   . Heart murmur     There are no active problems to display for this patient.   Past Surgical History:  Procedure Laterality Date  . BACK SURGERY    . CESAREAN SECTION Bilateral     Prior to Admission medications   Medication Sig Start Date End Date Taking? Authorizing Provider  albuterol (PROVENTIL HFA;VENTOLIN HFA) 108 (90 Base) MCG/ACT inhaler Inhale 2 puffs into the lungs every 6 (six) hours as needed for wheezing or shortness of breath.   Yes Historical Provider, MD  ALPRAZolam Prudy Feeler) 1 MG tablet Take 1 mg by mouth 3 (three) times daily. 10/08/15  Yes Historical Provider, MD  citalopram (CELEXA) 40 MG tablet Take 40 mg by mouth 2 (two) times daily.   Yes Historical Provider, MD  azithromycin (ZITHROMAX Z-PAK) 250 MG tablet Take 2 tablets (500 mg) on  Day 1,  followed by 1 tablet (250 mg)  once daily on Days 2 through 5. Patient not taking: Reported on 11/30/2015 11/16/15   Charmayne Sheer Beers, PA-C  lidocaine (XYLOCAINE) 2 % solution Use as directed 20 mLs in the mouth or throat as needed for mouth pain. Patient not taking: Reported on 11/30/2015 11/16/15   Charmayne Sheer Beers, PA-C  meloxicam (MOBIC) 15 MG tablet Take 1 tablet (15 mg total) by mouth daily. Patient not taking: Reported on 11/30/2015 11/16/15   Charmayne Sheer Beers, PA-C  oxyCODONE-acetaminophen (ROXICET) 5-325 MG tablet Take 1-2 tablets by mouth every 4 (four) hours as needed for severe pain. Patient not taking: Reported on 11/30/2015 11/16/15   Evangeline Dakin, PA-C    Allergies Clindamycin/lincomycin; Keflex [cephalexin]; Penicillins; and Sulfur  No family history on file.  Social History Social History  Substance Use Topics  . Smoking status: Current Every Day Smoker    Packs/day: 0.50    Types: Cigarettes  . Smokeless tobacco: Never Used  . Alcohol use No    Review of Systems Constitutional: No fever/chills Eyes: No visual changes. ENT: No sore throat. Cardiovascular: Denies chest pain. Respiratory: Denies shortness of breath Gastrointestinal:   No nausea, no vomiting.  No diarrhea.  No constipation. Genitourinary: Negative for dysuria. Musculoskeletal: Negative for back pain. Skin: Negative for rash. Neurological: Negative for headaches, focal weakness or numbness.  10-point ROS otherwise negative.  ____________________________________________   PHYSICAL EXAM:  VITAL  SIGNS: ED Triage Vitals  Enc Vitals Group     BP 11/30/15 1420 138/78     Pulse Rate 11/30/15 1420 90     Resp 11/30/15 1420 18     Temp 11/30/15 1420 98.6 F (37 C)     Temp Source 11/30/15 1420 Oral     SpO2 11/30/15 1420 100 %     Weight 11/30/15 1427 153 lb (69.4 kg)     Height 11/30/15 1427 4\' 11"  (1.499 m)     Head Circumference --      Peak Flow --      Pain Score 11/30/15 1427 10     Pain Loc --      Pain Edu? --       Excl. in GC? --     Constitutional: Alert and oriented. Well appearing and in no acute distress. Eyes: Conjunctivae are normal. PERRL. EOMI. Head: Atraumatic. Nose: No congestion/rhinnorhea. Mouth/Throat: Mucous membranes are moist.   Neck: No stridor.   Cardiovascular: Normal rate, regular rhythm. Grossly normal heart sounds.  Respiratory: Normal respiratory effort.  No retractions. Lungs CTAB. Gastrointestinal: Soft with mild to moderate lower abdominal tenderness to palpation. No rebound or guarding.. No distention.  No CVA tenderness. Genitourinary:  Normal external exam. Speculum exam was very uncomfortable for the patient but she was able to tolerated. Small amount of blood in the vault without any active bleeding. No pooling. Bimanual exam without cervical motion tenderness palpation but with uterine as well as bilateral adnexal tenderness palpation without masses palpated. Musculoskeletal: No lower extremity tenderness nor edema.  No joint effusions. Neurologic:  Normal speech and language. No gross focal neurologic deficits are appreciated. No gait instability. Skin:  Skin is warm, dry and intact. No rash noted. Psychiatric: Mood and affect are normal. Speech and behavior are normal.  ____________________________________________   LABS (all labs ordered are listed, but only abnormal results are displayed)  Labs Reviewed  WET PREP, GENITAL - Abnormal; Notable for the following:       Result Value   WBC, Wet Prep HPF POC FEW (*)    All other components within normal limits  COMPREHENSIVE METABOLIC PANEL - Abnormal; Notable for the following:    Glucose, Bld 107 (*)    ALT 8 (*)    All other components within normal limits  CHLAMYDIA/NGC RT PCR ([facility] ONLY)  HCG, QUANTITATIVE, PREGNANCY  CBC WITH DIFFERENTIAL/PLATELET  CBC WITH DIFFERENTIAL/PLATELET  URINALYSIS COMPLETEWITH MICROSCOPIC ([facility] ONLY)    ____________________________________________  EKG   ____________________________________________  RADIOLOGY  US Transvaginal Non-OB (Accession [id number]) (Order [id number])  Imaging  Date: 11/30/2015 Department: [facility] EMERGENCY DEPARTMENT Released By/Authorizing: Myrna Blazeravid Matthew Ihsan Nomura, MD (auto-released)  PACS Images   Show images for US Transvaginal Non-OB  Study Result   CLINICAL DATA:  Pelvic pain for 2 days.  EXAM: TRANSABDOMINAL AND TRANSVAGINAL ULTRASOUND OF PELVIS  DOPPLER ULTRASOUND OF OVARIES  TECHNIQUE: Both transabdominal and transvaginal ultrasound examinations of the pelvis were performed. Transabdominal technique was performed for global imaging of the pelvis including uterus, ovaries, adnexal regions, and pelvic cul-de-sac.  It was necessary to proceed with endovaginal exam following the transabdominal exam to visualize the uterus and ovaries. Color and duplex Doppler ultrasound was utilized to evaluate blood flow to the ovaries.  COMPARISON:  None.  FINDINGS: Uterus  Measurements: 6.6 x 4 x 4.9 cm. Uterus is retroverted. No fibroids or other mass visualized.  Endometrium  Thickness: 2.2 mm. Small amount of fluid within the endometrium.  This is likely to be physiologic. Correlation with patient's menstrual status is recommended.  Right ovary  Measurements: 2.3 x 1.9 x 2.4 cm. Normal follicular changes. No abnormal adnexal masses.  Left ovary  Measurements: 3.1 x 2.8 x 2.2 cm. Normal follicular changes. No abnormal adnexal masses.  Pulsed Doppler evaluation of both ovaries demonstrates normal low-resistance arterial and venous waveforms. Flow is demonstrated within both ovaries on color flow Doppler imaging.  Other findings  No abnormal free fluid.  IMPRESSION: Small amount of fluid in the endometrium with normal stripe thickness. This is probably physiologic but correlation  with patient's menstrual status is recommended. Normal follicular changes in the ovaries. No evidence of abnormal ovarian mass or torsion.   Electronically Signed   By: Burman Nieves M.D.   On: 11/30/2015 18:37    ____________________________________________   PROCEDURES  Procedure(s) performed:   Procedures  Critical Care performed:   ____________________________________________   INITIAL IMPRESSION / ASSESSMENT AND PLAN / ED COURSE  Pertinent labs & imaging results that were available during my care of the patient were reviewed by me and considered in my medical decision making (see chart for details).  ----------------------------------------- 7:34 PM on 11/30/2015 -----------------------------------------  Patient with very reassuring lab workup as well as ultrasound. Patient says that also her bleeding has slowed down dramatically and she has not had any more passage of clots. Discussed case with Dr. Tiburcio Pea and he agrees with starting the patient with Provera for 10 days. Patient is also requesting something for breakthrough pain. I reviewed her [place] substance registry prescriptions and she has not had any prescriptions for opiates since this past April. I discussed this plan for discharge with the patient and she is nursing and willing to comply. She'll attempt to give a urine sample. However, she says this does not feel like a UTI and would not refer to wait for urine sample but would rather go home at this time. She understands the risks of undiagnosed UTI and will return for any worsening or concerning symptoms.  Clinical Course     ____________________________________________   FINAL CLINICAL IMPRESSION(S) / ED DIAGNOSES  Final diagnoses:  Pelvic pain in female  Pelvic pain in female  Dysmenorrhea.    NEW MEDICATIONS STARTED DURING THIS VISIT:  New Prescriptions   No medications on file     Note:  This document was prepared using  Dragon voice recognition software and may include unintentional dictation errors.    Myrna Blazer, MD 11/30/15 [phone]

## 2015-11-30 NOTE — ED Notes (Signed)
Report from kim, rn. Dr. Alcide Goodnessscahevitz in to reexamine pt.

## 2015-11-30 NOTE — ED Triage Notes (Signed)
Pt is 5 months post-partum and presents with abdominal pain and vaginal bleeding. Pt states the flow is heavy & that she has to change her pad every 30-45 minutes.

## 2015-12-04 ENCOUNTER — Emergency Department
Admission: EM | Admit: 2015-12-04 | Discharge: 2015-12-04 | Disposition: A | Discharge status: Home / Self Care | Payer: Self-pay | Attending: Emergency Medicine | Admitting: Emergency Medicine

## 2015-12-04 ENCOUNTER — Emergency Department: Payer: Self-pay

## 2015-12-04 DIAGNOSIS — F1123 Opioid dependence with withdrawal: Secondary | ICD-10-CM

## 2015-12-04 DIAGNOSIS — R531 Weakness: Secondary | ICD-10-CM | POA: Insufficient documentation

## 2015-12-04 DIAGNOSIS — F909 Attention-deficit hyperactivity disorder, unspecified type: Secondary | ICD-10-CM | POA: Insufficient documentation

## 2015-12-04 DIAGNOSIS — Y999 Unspecified external cause status: Secondary | ICD-10-CM | POA: Insufficient documentation

## 2015-12-04 DIAGNOSIS — Z791 Long term (current) use of non-steroidal anti-inflammatories (NSAID): Secondary | ICD-10-CM | POA: Insufficient documentation

## 2015-12-04 DIAGNOSIS — Y939 Activity, unspecified: Secondary | ICD-10-CM | POA: Insufficient documentation

## 2015-12-04 DIAGNOSIS — F4010 Social phobia, unspecified: Secondary | ICD-10-CM

## 2015-12-04 DIAGNOSIS — W1800XA Striking against unspecified object with subsequent fall, initial encounter: Secondary | ICD-10-CM | POA: Insufficient documentation

## 2015-12-04 DIAGNOSIS — F1120 Opioid dependence, uncomplicated: Secondary | ICD-10-CM

## 2015-12-04 DIAGNOSIS — S0990XA Unspecified injury of head, initial encounter: Secondary | ICD-10-CM | POA: Insufficient documentation

## 2015-12-04 DIAGNOSIS — F17210 Nicotine dependence, cigarettes, uncomplicated: Secondary | ICD-10-CM | POA: Insufficient documentation

## 2015-12-04 DIAGNOSIS — Y929 Unspecified place or not applicable: Secondary | ICD-10-CM | POA: Insufficient documentation

## 2015-12-04 DIAGNOSIS — F1129 Opioid dependence with unspecified opioid-induced disorder: Secondary | ICD-10-CM | POA: Insufficient documentation

## 2015-12-04 DIAGNOSIS — F1320 Sedative, hypnotic or anxiolytic dependence, uncomplicated: Secondary | ICD-10-CM

## 2015-12-04 DIAGNOSIS — F1193 Opioid use, unspecified with withdrawal: Secondary | ICD-10-CM

## 2015-12-04 HISTORY — DX: Post-traumatic stress disorder, unspecified: F43.10

## 2015-12-04 HISTORY — DX: Anxiety disorder, unspecified: F41.9

## 2015-12-04 LAB — URINE DRUG SCREEN, QUALITATIVE (ARMC ONLY)
Amphetamines, Ur Screen: NOT DETECTED
BARBITURATES, UR SCREEN: NOT DETECTED
BENZODIAZEPINE, UR SCRN: POSITIVE — AB
CANNABINOID 50 NG, UR ~~LOC~~: NOT DETECTED
Cocaine Metabolite,Ur ~~LOC~~: POSITIVE — AB
MDMA (Ecstasy)Ur Screen: NOT DETECTED
Methadone Scn, Ur: NOT DETECTED
OPIATE, UR SCREEN: NOT DETECTED
PHENCYCLIDINE (PCP) UR S: NOT DETECTED
Tricyclic, Ur Screen: NOT DETECTED

## 2015-12-04 LAB — CBC
HEMATOCRIT: 40.7 % (ref 35.0–47.0)
Hemoglobin: 14 g/dL (ref 12.0–16.0)
MCH: 30.7 pg (ref 26.0–34.0)
MCHC: 34.5 g/dL (ref 32.0–36.0)
MCV: 89.1 fL (ref 80.0–100.0)
Platelets: 406 10*3/uL (ref 150–440)
RBC: 4.57 MIL/uL (ref 3.80–5.20)
RDW: 13.8 % (ref 11.5–14.5)
WBC: 9 10*3/uL (ref 3.6–11.0)

## 2015-12-04 LAB — COMPREHENSIVE METABOLIC PANEL
ALBUMIN: 4.3 g/dL (ref 3.5–5.0)
ALK PHOS: 47 U/L (ref 38–126)
ALT: 11 U/L — ABNORMAL LOW (ref 14–54)
ANION GAP: 8 (ref 5–15)
AST: 16 U/L (ref 15–41)
BILIRUBIN TOTAL: 0.3 mg/dL (ref 0.3–1.2)
BUN: 12 mg/dL (ref 6–20)
CALCIUM: 9.7 mg/dL (ref 8.9–10.3)
CO2: 26 mmol/L (ref 22–32)
Chloride: 107 mmol/L (ref 101–111)
Creatinine, Ser: 0.76 mg/dL (ref 0.44–1.00)
GFR calc Af Amer: >60 mL/min (ref >60)
GLUCOSE: 108 mg/dL — AB (ref 65–99)
Potassium: 4.1 mmol/L (ref 3.5–5.1)
Sodium: 141 mmol/L (ref 135–145)
TOTAL PROTEIN: 7.7 g/dL (ref 6.5–8.1)

## 2015-12-04 LAB — SALICYLATE LEVEL: Salicylate Lvl: <4 mg/dL (ref 2.8–30.0)

## 2015-12-04 LAB — ACETAMINOPHEN LEVEL

## 2015-12-04 LAB — ETHANOL: Alcohol, Ethyl (B): <5 mg/dL (ref <5)

## 2015-12-04 LAB — POCT PREGNANCY, URINE: Preg Test, Ur: NEGATIVE

## 2015-12-04 MED ORDER — DIAZEPAM 2 MG PO TABS
2.0000 mg | ORAL_TABLET | Freq: Once | ORAL | Status: AC
  Administered 2015-12-04: 2 mg via ORAL
  Filled 2015-12-04: qty 1

## 2015-12-04 MED ORDER — NICOTINE 21 MG/24HR TD PT24
MEDICATED_PATCH | TRANSDERMAL | Status: AC
  Administered 2015-12-04: 21 mg via TRANSDERMAL
  Filled 2015-12-04: qty 1

## 2015-12-04 MED ORDER — IBUPROFEN 800 MG PO TABS
800.0000 mg | ORAL_TABLET | Freq: Once | ORAL | Status: AC
  Administered 2015-12-04: 800 mg via ORAL

## 2015-12-04 MED ORDER — CITALOPRAM HYDROBROMIDE 40 MG PO TABS: 40.0000 mg | ORAL_TABLET | Freq: Two times a day (BID) | ORAL | 0 refills | Status: DC

## 2015-12-04 MED ORDER — CITALOPRAM HYDROBROMIDE 20 MG PO TABS
40.0000 mg | ORAL_TABLET | Freq: Every day | ORAL | Status: DC
  Administered 2015-12-04: 40 mg via ORAL
  Filled 2015-12-04: qty 2

## 2015-12-04 MED ORDER — IBUPROFEN 800 MG PO TABS
ORAL_TABLET | ORAL | Status: AC
Start: 2015-12-04 — End: 2015-12-04
  Administered 2015-12-04: 800 mg via ORAL
  Filled 2015-12-04: qty 1

## 2015-12-04 MED ORDER — NICOTINE 21 MG/24HR TD PT24
21.0000 mg | MEDICATED_PATCH | Freq: Once | TRANSDERMAL | Status: DC
  Administered 2015-12-04: 21 mg via TRANSDERMAL

## 2015-12-04 NOTE — ED Notes (Signed)
BEHAVIORAL HEALTH ROUNDING Patient sleeping: No. Patient alert and oriented: yes Behavior appropriate: Yes.  ; If no, describe:  Nutrition and fluids offered: yes Toileting and hygiene offered: Yes  Sitter present: q15 minute observations and security  monitoring Law enforcement present: Yes  ODS  

## 2015-12-04 NOTE — ED Notes (Signed)
Patient refusing for vitals to be taken. Patient is agitated, "What about my ativan? I'll just buy them off the fucking street." MD speaking with patient. Discharge instructions given. Patient encouraged to follow up with [facility].

## 2015-12-04 NOTE — ED Notes (Signed)
Gave pt [place] snack tray with sprite

## 2015-12-04 NOTE — ED Triage Notes (Addendum)
Pt is here with her mother with c/o being out her meds for the past 4 days and is having N/v/generalized weakness, slurred speech, sleeping then not able to sleep.. I asked the pt if she is under the influence of any drugs and pt denies.Marland Kitchen.denies SI..pt states she is out of her pain meds and is having a lot of lower back pain.. Mother states the pt has fallen in the past couple of days, pt has a swollen area to the right forehead with no noted bruising. Pt is requesting a pysh eval, states she was seeing someone in [place] and they stopped seeing her because of insurance and [facility] and needs her meds refilled.

## 2015-12-04 NOTE — Discharge Instructions (Signed)
Please seek medical attention for any high fevers, chest pain, shortness of breath, change in behavior, persistent vomiting, bloody stool or any other new or concerning symptoms.  

## 2015-12-04 NOTE — ED Notes (Signed)
Patient observed lying in recliner with eyes closed  Even, unlabored respirations observed   NAD pt appears to be sleeping  I will continue to monitor along with every 15 minute visual observations and ongoing security monitoring

## 2015-12-04 NOTE — [facility] Counselor (Signed)
Referral packet submitted to [facility] for review.

## 2015-12-04 NOTE — Consult Note (Signed)
[facility] Face-to-Face Psychiatry Consult   Reason for Consult:  30 year old woman who came to the emergency room saying she is having withdrawal symptoms from opiates and once replacement medicine. Referring Physician:  Cinda Quest Patient Identification: Andrea Gamble MRN:  [id number] Principal Diagnosis: Opiate withdrawal [facility]) Diagnosis:   Patient Active Problem List   Diagnosis Date Noted  . Opiate withdrawal ([place]) [F11.23] 12/04/2015  . Opiate dependence ([place]) [F11.20] 12/04/2015  . 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 Social anxiety disorder [F40.10] 12/04/2015    Total Time spent with patient: 1 hour  Subjective:   Andrea Gamble is a 30 y.o. female patient admitted with "I'm out of my meds".  HPI:  Patient interviewed. Chart reviewed. 30 year old woman came to the emergency room saying she's been off of her medication for about 5 days. She was taking Percocet and Xanax and Celexa. She says she was being prescribed these by a doctor in [place] but that because she ran out of her Medicaid they are refusing to renew her prescriptions. She reports that she's having diarrhea and nausea aches and pains malaise doesn't feel like getting up and doing anything. Mood feels anxious. Not feeling particularly depressed. No suicidal thoughts or hopelessness. No homicidal thoughts. Auditory or visual hallucinations. She is specifically asking to have replacement medicine for her opiates and benzodiazepines.  Social history: Says she lives with her husband and has 6 children at home as well. Not working outside the home.  Medical history: Apparently has some kind of orthopedic problem with chronic pain for which she has been maintained on narcotics in the past. No other active medical problems identified.  Substance abuse history: She denies a history of alcohol or drug abuse.  Past Psychiatric History: Denies any past suicide attempts no auditory or visual hallucinations. No inpatient  hospital stays. Was being given Celexa for what sounds like social anxiety disorder.  Risk to Self: Is patient at risk for suicide?: No Risk to Others:   Prior Inpatient Therapy:   Prior Outpatient Therapy:    Past Medical History:  Past Medical History:  Diagnosis Date  . ADHD (attention deficit hyperactivity disorder)   . Anxiety   . Heart murmur   . PTSD (post-traumatic stress disorder)     Past Surgical History:  Procedure Laterality Date  . BACK SURGERY    . CESAREAN SECTION Bilateral    Family History: No family history on file. Family Psychiatric  History: Family history positive for alcohol abuse in her mother Social History:  History  Alcohol Use No     History  Drug use: Unknown    Social History   Social History  . Marital status: Single    Spouse name: N/A  . Number of children: N/A  . Years of education: N/A   Social History Main Topics  . Smoking status: Current Every Day Smoker    Packs/day: 0.50    Types: Cigarettes  . Smokeless tobacco: Never Used  . Alcohol use No  . Drug use: Unknown  . Sexual activity: Not Asked   Other Topics Concern  . None   Social History Narrative  . None   Additional Social History:    Allergies:   Allergies  Allergen Reactions  . Clindamycin/Lincomycin Anaphylaxis  . Keflex [Cephalexin] Anaphylaxis  . Penicillins Anaphylaxis    Has patient had a PCN reaction causing immediate rash, facial/tongue/throat swelling, SOB or lightheadedness with hypotension: Yes Has patient had a PCN reaction causing severe rash involving mucus  membranes or skin necrosis: No Has patient had a PCN reaction that required hospitalization Yes Has patient had a PCN reaction occurring within the last 10 years: No If all of the above answers are "NO", then may proceed with Cephalosporin use.  . Sulfur Anaphylaxis    Labs:  Results for orders placed or performed during the hospital encounter of 12/04/15 (from the past 48 hour(s))   Comprehensive metabolic panel     Status: Abnormal   Collection Time: 12/04/15  1:54 PM  Result Value Ref Range   Sodium 141 135 - 145 mmol/L   Potassium 4.1 3.5 - 5.1 mmol/L   Chloride 107 101 - 111 mmol/L   CO2 26 22 - 32 mmol/L   Glucose, Bld 108 (H) 65 - 99 mg/dL   BUN 12 6 - 20 mg/dL   Creatinine, Ser 0.76 0.44 - 1.00 mg/dL   Calcium 9.7 8.9 - 10.3 mg/dL   Total Protein 7.7 6.5 - 8.1 g/dL   Albumin 4.3 3.5 - 5.0 g/dL   AST 16 15 - 41 U/L   ALT 11 (L) 14 - 54 U/L   Alkaline Phosphatase 47 38 - 126 U/L   Total Bilirubin 0.3 0.3 - 1.2 mg/dL   GFR calc non Af Amer >60 >60 mL/min   GFR calc Af Amer >60 >60 mL/min    Comment: (NOTE) The eGFR has been calculated using the CKD EPI equation. This calculation has not been validated in all clinical situations. eGFR's persistently <60 mL/min signify possible Chronic Kidney Disease.    Anion gap 8 5 - 15  Ethanol     Status: None   Collection Time: 12/04/15  1:54 PM  Result Value Ref Range   Alcohol, Ethyl (B) <5 <5 mg/dL    Comment:        LOWEST DETECTABLE LIMIT FOR SERUM ALCOHOL IS 5 mg/dL FOR MEDICAL PURPOSES ONLY   Salicylate level     Status: None   Collection Time: 12/04/15  1:54 PM  Result Value Ref Range   Salicylate Lvl <5.7 2.8 - 30.0 mg/dL  Acetaminophen level     Status: Abnormal   Collection Time: 12/04/15  1:54 PM  Result Value Ref Range   Acetaminophen (Tylenol), Serum <10 (L) 10 - 30 ug/mL    Comment:        THERAPEUTIC CONCENTRATIONS VARY SIGNIFICANTLY. A RANGE OF 10-30 ug/mL MAY BE AN EFFECTIVE CONCENTRATION FOR MANY PATIENTS. HOWEVER, SOME ARE BEST TREATED AT CONCENTRATIONS OUTSIDE THIS RANGE. ACETAMINOPHEN CONCENTRATIONS >150 ug/mL AT 4 HOURS AFTER INGESTION AND >50 ug/mL AT 12 HOURS AFTER INGESTION ARE OFTEN ASSOCIATED WITH TOXIC REACTIONS.   cbc     Status: None   Collection Time: 12/04/15  1:54 PM  Result Value Ref Range   WBC 9.0 3.6 - 11.0 K/uL   RBC 4.57 3.80 - 5.20 MIL/uL    Hemoglobin 14.0 12.0 - 16.0 g/dL   HCT 40.7 35.0 - 47.0 %   MCV 89.1 80.0 - 100.0 fL   MCH 30.7 26.0 - 34.0 pg   MCHC 34.5 32.0 - 36.0 g/dL   RDW 13.8 11.5 - 14.5 %   Platelets 406 150 - 440 K/uL  Urine Drug Screen, Qualitative     Status: Abnormal   Collection Time: 12/04/15  1:54 PM  Result Value Ref Range   Tricyclic, Ur Screen NONE DETECTED NONE DETECTED   Amphetamines, Ur Screen NONE DETECTED NONE DETECTED   MDMA (Ecstasy)Ur Screen NONE DETECTED NONE DETECTED  Cocaine Metabolite,Ur [place] POSITIVE (A) NONE DETECTED   Opiate, Ur Screen NONE DETECTED NONE DETECTED   Phencyclidine (PCP) Ur S NONE DETECTED NONE DETECTED   Cannabinoid 50 Ng, Ur [place] NONE DETECTED NONE DETECTED   Barbiturates, Ur Screen NONE DETECTED NONE DETECTED   Benzodiazepine, Ur Scrn POSITIVE (A) NONE DETECTED   Methadone Scn, Ur NONE DETECTED NONE DETECTED    Comment: (NOTE) 100  Tricyclics, urine               Cutoff 1000 ng/mL 200  Amphetamines, urine             Cutoff 1000 ng/mL 300  MDMA (Ecstasy), urine           Cutoff 500 ng/mL 400  Cocaine Metabolite, urine       Cutoff 300 ng/mL 500  Opiate, urine                   Cutoff 300 ng/mL 600  Phencyclidine (PCP), urine      Cutoff 25 ng/mL 700  Cannabinoid, urine              Cutoff 50 ng/mL 800  Barbiturates, urine             Cutoff 200 ng/mL 900  Benzodiazepine, urine           Cutoff 200 ng/mL 1000 Methadone, urine                Cutoff 300 ng/mL 1100 1200 The urine drug screen provides only a preliminary, unconfirmed 1300 analytical test result and should not be used for non-medical 1400 purposes. Clinical consideration and professional judgment should 1500 be applied to any positive drug screen result due to possible 1600 interfering substances. A more specific alternate chemical method 1700 must be used in order to obtain a confirmed analytical result.  1800 Gas chromato graphy / mass spectrometry (GC/MS) is the preferred 1900 confirmatory  method.   Pregnancy, urine POC     Status: None   Collection Time: 12/04/15  2:04 PM  Result Value Ref Range   Preg Test, Ur NEGATIVE NEGATIVE    Comment:        THE SENSITIVITY OF THIS METHODOLOGY IS >24 mIU/mL     Current Facility-Administered Medications  Medication Dose Route Frequency Provider Last Rate Last Dose  . citalopram (CELEXA) tablet 40 mg  40 mg Oral Daily Arnaldo Natal, MD   40 mg at 12/04/15 1658   Current Outpatient Prescriptions  Medication Sig Dispense Refill  . albuterol (PROVENTIL HFA;VENTOLIN HFA) 108 (90 Base) MCG/ACT inhaler Inhale 2 puffs into the lungs every 6 (six) hours as needed for wheezing or shortness of breath.    . ALPRAZolam (XANAX) 1 MG tablet Take 1 mg by mouth 3 (three) times daily.  0  . azithromycin (ZITHROMAX Z-PAK) 250 MG tablet Take 2 tablets (500 mg) on  Day 1,  followed by 1 tablet (250 mg) once daily on Days 2 through 5. (Patient not taking: Reported on 11/30/2015) 6 each 0  . citalopram (CELEXA) 40 MG tablet Take 40 mg by mouth 2 (two) times daily.    Marland Kitchen lidocaine (XYLOCAINE) 2 % solution Use as directed 20 mLs in the mouth or throat as needed for mouth pain. (Patient not taking: Reported on 11/30/2015) 100 mL 0  . medroxyPROGESTERone (PROVERA) 10 MG tablet Take 1 tablet (10 mg total) by mouth daily. 10 tablet 0  . meloxicam (MOBIC) 15 MG tablet Take  1 tablet (15 mg total) by mouth daily. (Patient not taking: Reported on 11/30/2015) 30 tablet 0  . oxyCODONE-acetaminophen (ROXICET) 5-325 MG tablet Take 1-2 tablets by mouth every 4 (four) hours as needed for severe pain. (Patient not taking: Reported on 11/30/2015) 15 tablet 0    Musculoskeletal: Strength & Muscle Tone: within normal limits Gait & Station: unsteady Patient leans: N/A  Psychiatric Specialty Exam: Physical Exam  Nursing note and vitals reviewed. Constitutional: She appears well-developed and well-nourished.  HENT:  Head: Normocephalic and atraumatic.  Eyes: Conjunctivae are  normal. Pupils are equal, round, and reactive to light.  Neck: Normal range of motion.  Cardiovascular: Normal heart sounds.   Respiratory: Effort normal.  GI: Soft.  Musculoskeletal: Normal range of motion.  Neurological: She is alert.  Skin: Skin is warm and dry.  Psychiatric: Her mood appears anxious. Her affect is angry and labile. Her speech is rapid and/or pressured. She is agitated. Cognition and memory are normal. She expresses inappropriate judgment. She expresses no homicidal and no suicidal ideation.    Review of Systems  Constitutional: Negative.   HENT: Negative.   Eyes: Negative.   Respiratory: Negative.   Cardiovascular: Negative.   Gastrointestinal: Positive for diarrhea, nausea and vomiting.  Musculoskeletal: Positive for myalgias.  Skin: Negative.   Neurological: Negative.   Psychiatric/Behavioral: Negative for depression, hallucinations, memory loss, substance abuse and suicidal ideas. The patient has insomnia. The patient is not nervous/anxious.     Blood pressure 103/86, pulse 100, temperature 98.2 F (36.8 C), temperature source Oral, resp. rate 16, height '4\' 11"'$  (1.499 m), weight 69.4 kg (153 lb), last menstrual period 10/30/2015, SpO2 100 %.Body mass index is 30.9 kg/m.  General Appearance: Casual  Eye Contact:  Good  Speech:  Clear and Coherent  Volume:  Normal  Mood:  Dysphoric  Affect:  Full Range  Thought Process:  Goal Directed  Orientation:  Full (Time, Place, and Person)  Thought Content:  Logical  Suicidal Thoughts:  No  Homicidal Thoughts:  No  Memory:  Immediate;   Fair Recent;   Fair Remote;   Fair  Judgement:  Impaired  Insight:  Lacking  Psychomotor Activity:  Decreased  Concentration:  Concentration: Fair  Recall:  [employer] of Knowledge:  Fair  Language:  Fair  Akathisia:  No  Handed:  Right  AIMS (if indicated):     Assets:  Communication Skills Desire for Improvement Housing Resilience  ADL's:  Intact  Cognition:  WNL   Sleep:        Treatment Plan Summary: Plan 30 year old woman with a history of opiate and benzodiazepine dependence now having withdrawal symptoms. No suicidal thoughts not depressed. Patient does not require inpatient treatment at all. Explained to the patient that I cannot prescribe controlled substances or replacement medicine for her Xanax or Percocet. Patient became irritable and argumentative. States that she won't leave here until we give her replacement medicine. Case reviewed with hospitalist. No indication for any other treatment right now. Recommend patient be discharged and that she follow-up at [facility] with outpatient treatment. I have told her I would give her a prescription for her citalopram if she wanted it.  Disposition: Patient does not meet criteria for psychiatric inpatient admission.  Alethia Berthold, MD 12/04/2015 5:51 PM

## 2015-12-04 NOTE — ED Notes (Signed)

## 2015-12-04 NOTE — ED Provider Notes (Signed)
[facility] Emergency Department Provider Note   ____________________________________________   First MD Initiated Contact with Patient 12/04/15 1426     (approximate)  I have reviewed the triage vital signs and the nursing notes.   HISTORY  Chief Complaint Nausea; Emesis; Weakness; and Medical Clearance    HPI Andrea Gamble is a 30 y.o. female patient comes in reporting she ran out of her medicines about 4 days ago. She says she has no energy. She has the old medicine containers here she has a container for citalopram 40 mg 1 a day which was written on July 20 she has 79 Strattera which does not have a date on it and one for Xanax which was written for May 2. Sensation when she gets off her medicine that she has seizures. She says she's had 6 seizures in the last 4 days. Where she gets very stiff. She says she's fallen and hit her head. She does have a bruise on her head small abrasion right at the forehead at the hairline and some other bruising on the arm. She also complains of nausea and vomiting which she did not tell me. Past Medical History:  Diagnosis Date  . ADHD (attention deficit hyperactivity disorder)   . Anxiety   . Heart murmur   . PTSD (post-traumatic stress disorder)     There are no active problems to display for this patient.   Past Surgical History:  Procedure Laterality Date  . BACK SURGERY    . CESAREAN SECTION Bilateral     Prior to Admission medications   Medication Sig Start Date End Date Taking? Authorizing Provider  albuterol (PROVENTIL HFA;VENTOLIN HFA) 108 (90 Base) MCG/ACT inhaler Inhale 2 puffs into the lungs every 6 (six) hours as needed for wheezing or shortness of breath.    Historical Provider, MD  ALPRAZolam Prudy Feeler) 1 MG tablet Take 1 mg by mouth 3 (three) times daily. 10/08/15   Historical Provider, MD  azithromycin (ZITHROMAX Z-PAK) 250 MG tablet Take 2 tablets (500 mg) on  Day 1,  followed by 1 tablet (250 mg)  once daily on Days 2 through 5. Patient not taking: Reported on 11/30/2015 11/16/15   Charmayne Sheer Beers, PA-C  citalopram (CELEXA) 40 MG tablet Take 40 mg by mouth 2 (two) times daily.    Historical Provider, MD  lidocaine (XYLOCAINE) 2 % solution Use as directed 20 mLs in the mouth or throat as needed for mouth pain. Patient not taking: Reported on 11/30/2015 11/16/15   Charmayne Sheer Beers, PA-C  medroxyPROGESTERone (PROVERA) 10 MG tablet Take 1 tablet (10 mg total) by mouth daily. 11/30/15 11/29/16  Myrna Blazer, MD  meloxicam (MOBIC) 15 MG tablet Take 1 tablet (15 mg total) by mouth daily. Patient not taking: Reported on 11/30/2015 11/16/15   Charmayne Sheer Beers, PA-C  oxyCODONE-acetaminophen (ROXICET) 5-325 MG tablet Take 1-2 tablets by mouth every 4 (four) hours as needed for severe pain. Patient not taking: Reported on 11/30/2015 11/16/15   Evangeline Dakin, PA-C    Allergies Clindamycin/lincomycin; Keflex [cephalexin]; Penicillins; and Sulfur  No family history on file.  Social History Social History  Substance Use Topics  . Smoking status: Current Every Day Smoker    Packs/day: 0.50    Types: Cigarettes  . Smokeless tobacco: Never Used  . Alcohol use No    Review of Systems Constitutional: No fever/chills Eyes: No visual changes. ENT: No sore throat. Cardiovascular: Denies chest pain. Respiratory: Denies shortness of breath.  Gastrointestinal: No abdominal pain.   nausea,vomiting.  No diarrhea.  No constipation. Genitourinary: Negative for dysuria. Musculoskeletal: Negative for back pain. Skin: Negative for rash. Neurological: Negative for headaches, focal weakness or numbness.  10-point ROS otherwise negative.  ____________________________________________   PHYSICAL EXAM:  VITAL SIGNS: ED Triage Vitals [12/04/15 1347]  Enc Vitals Group     BP 103/86     Pulse Rate 100     Resp 16     Temp 98.2 F (36.8 C)     Temp Source Oral     SpO2 100 %     Weight 153 lb (69.4 kg)       Height 4\' 11"  (1.499 m)     Head Circumference      Peak Flow      Pain Score 10     Pain Loc      Pain Edu?      Excl. in GC?     Constitutional: Alert and oriented. Well appearing and in no acute distress. Eyes: Conjunctivae are normal. PERRL. EOMI. Head: Atraumatic. Nose: No congestion/rhinnorhea. Mouth/Throat: Mucous membranes are moist.  Oropharynx non-erythematous. Neck: No stridor. Cardiovascular: Normal rate, regular rhythm. Grossly normal heart sounds.  Good peripheral circulation. Respiratory: Normal respiratory effort.  No retractions. Lungs CTAB. Gastrointestinal: Soft and nontender. No distention. No abdominal bruits. No CVA tenderness. Musculoskeletal: No lower extremity tenderness nor edema.  No joint effusions. Neurologic:  Normal speech and language. No gross focal neurologic deficits are appreciated. Skin:  Skin is warm, dry and intact. No rash noted. Psychiatric: Mood and affect are normal. Speech and behavior are normal.  ____________________________________________   LABS (all labs ordered are listed, but only abnormal results are displayed)  Labs Reviewed  COMPREHENSIVE METABOLIC PANEL - Abnormal; Notable for the following:       Result Value   Glucose, Bld 108 (*)    ALT 11 (*)    All other components within normal limits  ACETAMINOPHEN LEVEL - Abnormal; Notable for the following:    Acetaminophen (Tylenol), Serum <10 (*)    All other components within normal limits  URINE DRUG SCREEN, QUALITATIVE ([facility] ONLY) - Abnormal; Notable for the following:    Cocaine Metabolite,Ur [place] POSITIVE (*)    Benzodiazepine, Ur Scrn POSITIVE (*)    All other components within normal limits  ETHANOL  SALICYLATE LEVEL  CBC  POC URINE PREG, ED  POCT PREGNANCY, URINE   ____________________________________________  EKG   ____________________________________________  RADIOLOGY   ____________________________________________   PROCEDURES  Procedure(s)  performed:   Procedures  Critical Care performed:  ____________________________________________   INITIAL IMPRESSION / ASSESSMENT AND PLAN / ED COURSE  Pertinent labs & imaging results that were available during my care of the patient were reviewed by me and considered in my medical decision making (see chart for details).  [place] substances [place] shows that she's had 2 prescriptions for oxycodone 1 for 30 tablets on April 10 and one for 10 tablets on April 13 of this year. Nothing else in the computer for those medicines  Clinical Course    Patient will be signed out to Dr. Derrill KayGoodman ____________________________________________   FINAL CLINICAL IMPRESSION(S) / ED DIAGNOSES  Final diagnoses:  Weakness      NEW MEDICATIONS STARTED DURING THIS VISIT:  New Prescriptions   No medications on file     Note:  This document was prepared using Dragon voice recognition software and may include unintentional dictation errors.    Renae FicklePaul  George Hugh, MD 12/04/15 1459

## 2016-03-04 ENCOUNTER — Emergency Department
Admission: EM | Admit: 2016-03-04 | Discharge: 2016-03-04 | Disposition: A | Discharge status: Home / Self Care | Payer: Self-pay | Attending: Emergency Medicine | Admitting: Emergency Medicine

## 2016-03-04 ENCOUNTER — Emergency Department: Payer: Self-pay

## 2016-03-04 ENCOUNTER — Encounter: Payer: Self-pay | Admitting: Emergency Medicine

## 2016-03-04 DIAGNOSIS — W010XXA Fall on same level from slipping, tripping and stumbling without subsequent striking against object, initial encounter: Secondary | ICD-10-CM | POA: Insufficient documentation

## 2016-03-04 DIAGNOSIS — F17210 Nicotine dependence, cigarettes, uncomplicated: Secondary | ICD-10-CM | POA: Insufficient documentation

## 2016-03-04 DIAGNOSIS — M5442 Lumbago with sciatica, left side: Secondary | ICD-10-CM | POA: Insufficient documentation

## 2016-03-04 DIAGNOSIS — Y999 Unspecified external cause status: Secondary | ICD-10-CM | POA: Insufficient documentation

## 2016-03-04 DIAGNOSIS — Z79899 Other long term (current) drug therapy: Secondary | ICD-10-CM | POA: Insufficient documentation

## 2016-03-04 DIAGNOSIS — Y9389 Activity, other specified: Secondary | ICD-10-CM | POA: Insufficient documentation

## 2016-03-04 DIAGNOSIS — Y929 Unspecified place or not applicable: Secondary | ICD-10-CM | POA: Insufficient documentation

## 2016-03-04 LAB — URINALYSIS, COMPLETE (UACMP) WITH MICROSCOPIC
BILIRUBIN URINE: NEGATIVE
GLUCOSE, UA: NEGATIVE mg/dL
KETONES UR: NEGATIVE mg/dL
LEUKOCYTES UA: NEGATIVE
Nitrite: NEGATIVE
PH: 5 (ref 5.0–8.0)
PROTEIN: NEGATIVE mg/dL
Specific Gravity, Urine: 1.016 (ref 1.005–1.030)

## 2016-03-04 LAB — POCT PREGNANCY, URINE: Preg Test, Ur: NEGATIVE

## 2016-03-04 MED ORDER — CYCLOBENZAPRINE HCL 10 MG PO TABS: 10.0000 mg | ORAL_TABLET | Freq: Three times a day (TID) | ORAL | 0 refills | Status: DC | PRN

## 2016-03-04 MED ORDER — MELOXICAM 15 MG PO TABS: 15.0000 mg | ORAL_TABLET | Freq: Every day | ORAL | 0 refills | Status: AC

## 2016-03-04 MED ORDER — NAPROXEN 500 MG PO TABS: 500.0000 mg | ORAL_TABLET | Freq: Two times a day (BID) | ORAL | 0 refills | Status: DC

## 2016-03-04 MED ORDER — GABAPENTIN 300 MG PO CAPS: 300.0000 mg | ORAL_CAPSULE | Freq: Three times a day (TID) | ORAL | 1 refills | Status: DC
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
## 2016-03-04 NOTE — ED Notes (Signed)
Patient ambulatory to room. Here for pain management after a slip/trip fall she had 2 days ago. Hx of back pain. However, this time pain is radiating down right leg.

## 2016-03-04 NOTE — Discharge Instructions (Signed)
Follow up with the orthopedic specialist for symptoms that are not improving over the week. °Return to the ER for symptoms that change or worsen if unable to schedule an appointment. °

## 2016-07-02 ENCOUNTER — Inpatient Hospital Stay
Admission: EM | Admit: 2016-07-02 | Discharge: 2016-07-04 | DRG: 923 | Disposition: A | Discharge status: Home / Self Care | Payer: Self-pay | Attending: Internal Medicine | Admitting: Internal Medicine

## 2016-07-02 ENCOUNTER — Emergency Department: Payer: Self-pay

## 2016-07-02 DIAGNOSIS — R011 Cardiac murmur, unspecified: Secondary | ICD-10-CM | POA: Diagnosis present

## 2016-07-02 DIAGNOSIS — Z880 Allergy status to penicillin: Secondary | ICD-10-CM

## 2016-07-02 DIAGNOSIS — F17210 Nicotine dependence, cigarettes, uncomplicated: Secondary | ICD-10-CM | POA: Diagnosis present

## 2016-07-02 DIAGNOSIS — I959 Hypotension, unspecified: Secondary | ICD-10-CM | POA: Diagnosis present

## 2016-07-02 DIAGNOSIS — R58 Hemorrhage, not elsewhere classified: Secondary | ICD-10-CM

## 2016-07-02 DIAGNOSIS — F329 Major depressive disorder, single episode, unspecified: Secondary | ICD-10-CM | POA: Diagnosis present

## 2016-07-02 DIAGNOSIS — T424X4A Poisoning by benzodiazepines, undetermined, initial encounter: Secondary | ICD-10-CM | POA: Diagnosis present

## 2016-07-02 DIAGNOSIS — T7421XA Adult sexual abuse, confirmed, initial encounter: Principal | ICD-10-CM | POA: Diagnosis present

## 2016-07-02 DIAGNOSIS — Z638 Other specified problems related to primary support group: Secondary | ICD-10-CM

## 2016-07-02 DIAGNOSIS — F909 Attention-deficit hyperactivity disorder, unspecified type: Secondary | ICD-10-CM | POA: Diagnosis present

## 2016-07-02 DIAGNOSIS — R102 Pelvic and perineal pain: Secondary | ICD-10-CM

## 2016-07-02 DIAGNOSIS — F4310 Post-traumatic stress disorder, unspecified: Secondary | ICD-10-CM | POA: Diagnosis present

## 2016-07-02 DIAGNOSIS — Z881 Allergy status to other antibiotic agents status: Secondary | ICD-10-CM

## 2016-07-02 DIAGNOSIS — F4010 Social phobia, unspecified: Secondary | ICD-10-CM | POA: Diagnosis present

## 2016-07-02 DIAGNOSIS — E860 Dehydration: Secondary | ICD-10-CM | POA: Diagnosis present

## 2016-07-02 DIAGNOSIS — F1120 Opioid dependence, uncomplicated: Secondary | ICD-10-CM | POA: Diagnosis present

## 2016-07-02 DIAGNOSIS — Z79899 Other long term (current) drug therapy: Secondary | ICD-10-CM

## 2016-07-02 DIAGNOSIS — F4325 Adjustment disorder with mixed disturbance of emotions and conduct: Secondary | ICD-10-CM | POA: Diagnosis present

## 2016-07-02 DIAGNOSIS — Z882 Allergy status to sulfonamides status: Secondary | ICD-10-CM

## 2016-07-02 DIAGNOSIS — R4182 Altered mental status, unspecified: Secondary | ICD-10-CM | POA: Diagnosis present

## 2016-07-02 LAB — URINE DRUG SCREEN, QUALITATIVE (ARMC ONLY)
AMPHETAMINES, UR SCREEN: NOT DETECTED
Barbiturates, Ur Screen: NOT DETECTED
Benzodiazepine, Ur Scrn: POSITIVE — AB
Cannabinoid 50 Ng, Ur ~~LOC~~: NOT DETECTED
Cocaine Metabolite,Ur ~~LOC~~: NOT DETECTED
MDMA (ECSTASY) UR SCREEN: NOT DETECTED
Methadone Scn, Ur: NOT DETECTED
Opiate, Ur Screen: NOT DETECTED
Phencyclidine (PCP) Ur S: NOT DETECTED
TRICYCLIC, UR SCREEN: NOT DETECTED

## 2016-07-02 LAB — COMPREHENSIVE METABOLIC PANEL
ALBUMIN: 4.1 g/dL (ref 3.5–5.0)
ALK PHOS: 46 U/L (ref 38–126)
ALT: 14 U/L (ref 14–54)
ANION GAP: 6 (ref 5–15)
AST: 24 U/L (ref 15–41)
BILIRUBIN TOTAL: 0.3 mg/dL (ref 0.3–1.2)
BUN: 8 mg/dL (ref 6–20)
CALCIUM: 9.3 mg/dL (ref 8.9–10.3)
CO2: 33 mmol/L — AB (ref 22–32)
Chloride: 101 mmol/L (ref 101–111)
Creatinine, Ser: 0.68 mg/dL (ref 0.44–1.00)
GFR calc Af Amer: >60 mL/min (ref >60)
GFR calc non Af Amer: >60 mL/min (ref >60)
GLUCOSE: 88 mg/dL (ref 65–99)
POTASSIUM: 4.3 mmol/L (ref 3.5–5.1)
SODIUM: 140 mmol/L (ref 135–145)
TOTAL PROTEIN: 7.4 g/dL (ref 6.5–8.1)

## 2016-07-02 LAB — CBC
HEMATOCRIT: 39 % (ref 35.0–47.0)
HEMOGLOBIN: 13.4 g/dL (ref 12.0–16.0)
MCH: 31.1 pg (ref 26.0–34.0)
MCHC: 34.4 g/dL (ref 32.0–36.0)
MCV: 90.5 fL (ref 80.0–100.0)
Platelets: 379 10*3/uL (ref 150–440)
RBC: 4.3 MIL/uL (ref 3.80–5.20)
RDW: 12.7 % (ref 11.5–14.5)
WBC: 6.8 10*3/uL (ref 3.6–11.0)

## 2016-07-02 LAB — ETHANOL

## 2016-07-02 LAB — TROPONIN I: Troponin I: <0.03 ng/mL (ref <0.03)

## 2016-07-02 LAB — PREGNANCY, URINE: PREG TEST UR: NEGATIVE

## 2016-07-02 MED ORDER — SODIUM CHLORIDE 0.9 % IV BOLUS (SEPSIS)
1000.0000 mL | Freq: Once | INTRAVENOUS | Status: AC
  Administered 2016-07-03: 1000 mL via INTRAVENOUS

## 2016-07-02 MED ORDER — ACETAMINOPHEN 325 MG PO TABS
650.0000 mg | ORAL_TABLET | Freq: Once | ORAL | Status: AC
  Administered 2016-07-02: 650 mg via ORAL
  Filled 2016-07-02: qty 2

## 2016-07-02 MED ORDER — SODIUM CHLORIDE 0.9 % IV BOLUS (SEPSIS)
1000.0000 mL | Freq: Once | INTRAVENOUS | Status: AC
  Administered 2016-07-02: 1000 mL via INTRAVENOUS

## 2016-07-02 NOTE — ED Notes (Signed)
The EKG was signed by Dr. Sharma Covert. The EKG was exported into the system.
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
## 2016-07-02 NOTE — ED Notes (Signed)
[facility] with pt. Pt sleeping, attempted to awaken and have her speak with her, but pt just mumbles and went back to sleep.

## 2016-07-02 NOTE — ED Notes (Signed)
Left msg with sane nurse

## 2016-07-03 ENCOUNTER — Ambulatory Visit (HOSPITAL_COMMUNITY)
Admission: EM | Admit: 2016-07-03 | Discharge: 2016-07-03 | Disposition: A | Discharge status: Home / Self Care | Payer: No Typology Code available for payment source | Source: Ambulatory Visit | Attending: Emergency Medicine | Admitting: Emergency Medicine

## 2016-07-03 DIAGNOSIS — Z0441 Encounter for examination and observation following alleged adult rape: Secondary | ICD-10-CM | POA: Insufficient documentation

## 2016-07-03 DIAGNOSIS — F4325 Adjustment disorder with mixed disturbance of emotions and conduct: Secondary | ICD-10-CM

## 2016-07-03 DIAGNOSIS — I959 Hypotension, unspecified: Secondary | ICD-10-CM | POA: Diagnosis present

## 2016-07-03 LAB — URINALYSIS, COMPLETE (UACMP) WITH MICROSCOPIC
Bilirubin Urine: NEGATIVE
Glucose, UA: NEGATIVE mg/dL
Hgb urine dipstick: NEGATIVE
Ketones, ur: NEGATIVE mg/dL
Leukocytes, UA: NEGATIVE
Nitrite: NEGATIVE
Protein, ur: NEGATIVE mg/dL
Specific Gravity, Urine: 1.009 (ref 1.005–1.030)
pH: 7 (ref 5.0–8.0)

## 2016-07-03 LAB — RAPID HIV SCREEN (HIV 1/2 AB+AG)
HIV 1/2 Antibodies: NONREACTIVE
HIV-1 P24 Antigen - HIV24: NONREACTIVE

## 2016-07-03 LAB — TSH: TSH: 0.668 u[IU]/mL (ref 0.350–4.500)

## 2016-07-03 MED ORDER — MELOXICAM 7.5 MG PO TABS
15.0000 mg | ORAL_TABLET | Freq: Every day | ORAL | Status: DC
  Administered 2016-07-03 – 2016-07-04 (×2): 15 mg via ORAL
  Filled 2016-07-03: qty 1
  Filled 2016-07-03: qty 2

## 2016-07-03 MED ORDER — ACETAMINOPHEN 650 MG RE SUPP: 650.0000 mg | Freq: Four times a day (QID) | RECTAL | Status: DC | PRN

## 2016-07-03 MED ORDER — LIDOCAINE 1% INJECTION FOR CIRCUMCISION
0.9000 mL | INJECTION | Freq: Once | INTRAVENOUS | Status: DC
  Filled 2016-07-03: qty 1

## 2016-07-03 MED ORDER — METRONIDAZOLE 500 MG PO TABS
2000.0000 mg | ORAL_TABLET | Freq: Once | ORAL | Status: AC
  Administered 2016-07-03: 2000 mg via ORAL
  Filled 2016-07-03: qty 4

## 2016-07-03 MED ORDER — GABAPENTIN 300 MG PO CAPS
300.0000 mg | ORAL_CAPSULE | Freq: Three times a day (TID) | ORAL | Status: DC
  Administered 2016-07-03 – 2016-07-04 (×4): 300 mg via ORAL
  Filled 2016-07-03 (×4): qty 1

## 2016-07-03 MED ORDER — DOCUSATE SODIUM 100 MG PO CAPS
100.0000 mg | ORAL_CAPSULE | Freq: Two times a day (BID) | ORAL | Status: DC
  Administered 2016-07-03 – 2016-07-04 (×3): 100 mg via ORAL
  Filled 2016-07-03 (×3): qty 1

## 2016-07-03 MED ORDER — ELVITEG-COBIC-EMTRICIT-TENOFAF 150-150-200-10 MG PO TABS
1.0000 | ORAL_TABLET | Freq: Every day | ORAL | Status: DC
  Administered 2016-07-03 – 2016-07-04 (×2): 1 via ORAL
  Filled 2016-07-03 (×3): qty 1

## 2016-07-03 MED ORDER — ELVITEG-COBIC-EMTRICIT-TENOFAF 150-150-200-10 MG PO TABS: 1.0000 | ORAL_TABLET | Freq: Every day | ORAL | 0 refills | Status: DC

## 2016-07-03 MED ORDER — ONDANSETRON HCL 4 MG PO TABS: 4.0000 mg | ORAL_TABLET | Freq: Four times a day (QID) | ORAL | Status: DC | PRN

## 2016-07-03 MED ORDER — MIDODRINE HCL 5 MG PO TABS
5.0000 mg | ORAL_TABLET | Freq: Three times a day (TID) | ORAL | Status: DC
  Administered 2016-07-04 (×2): 5 mg via ORAL
  Filled 2016-07-03 (×2): qty 1

## 2016-07-03 MED ORDER — BUPRENORPHINE HCL-NALOXONE HCL 8-2 MG SL SUBL
1.0000 | SUBLINGUAL_TABLET | Freq: Every day | SUBLINGUAL | Status: DC
  Administered 2016-07-03: 1 via SUBLINGUAL
  Filled 2016-07-03: qty 1

## 2016-07-03 MED ORDER — SODIUM CHLORIDE 0.9% FLUSH
3.0000 mL | Freq: Two times a day (BID) | INTRAVENOUS | Status: DC
  Administered 2016-07-04: 3 mL via INTRAVENOUS

## 2016-07-03 MED ORDER — ONDANSETRON HCL 4 MG/2ML IJ SOLN: 4.0000 mg | Freq: Four times a day (QID) | INTRAMUSCULAR | Status: DC | PRN

## 2016-07-03 MED ORDER — AZITHROMYCIN 500 MG PO TABS
1000.0000 mg | ORAL_TABLET | Freq: Once | ORAL | Status: AC
  Administered 2016-07-03: 13:00:00 1000 mg via ORAL
  Filled 2016-07-03: qty 2

## 2016-07-03 MED ORDER — ULIPRISTAL ACETATE 30 MG PO TABS
30.0000 mg | ORAL_TABLET | Freq: Once | ORAL | Status: AC
  Administered 2016-07-03: 30 mg via ORAL
  Filled 2016-07-03: qty 1

## 2016-07-03 MED ORDER — LIDOCAINE HCL (PF) 1 % IJ SOLN
0.9000 mL | Freq: Once | INTRAMUSCULAR | Status: AC
  Administered 2016-07-03: 13:00:00 0.9 mL
  Filled 2016-07-03: qty 2

## 2016-07-03 MED ORDER — CEFTRIAXONE SODIUM 250 MG IJ SOLR
250.0000 mg | Freq: Once | INTRAMUSCULAR | Status: AC
  Administered 2016-07-03: 13:00:00 250 mg via INTRAMUSCULAR
  Filled 2016-07-03: qty 250

## 2016-07-03 MED ORDER — BUPRENORPHINE HCL-NALOXONE HCL 8-2 MG SL SUBL
1.0000 | SUBLINGUAL_TABLET | Freq: Every day | SUBLINGUAL | Status: DC
  Filled 2016-07-03: qty 1

## 2016-07-03 MED ORDER — SODIUM CHLORIDE 0.9 % IV BOLUS (SEPSIS)
1000.0000 mL | Freq: Once | INTRAVENOUS | Status: AC
  Administered 2016-07-03: 1000 mL via INTRAVENOUS

## 2016-07-03 MED ORDER — ALPRAZOLAM 1 MG PO TABS
1.0000 mg | ORAL_TABLET | Freq: Three times a day (TID) | ORAL | Status: DC | PRN
  Administered 2016-07-03: 1 mg via ORAL
  Filled 2016-07-03: qty 1

## 2016-07-03 MED ORDER — SODIUM CHLORIDE 0.9 % IV SOLN: INTRAVENOUS | Status: DC

## 2016-07-03 MED ORDER — CITALOPRAM HYDROBROMIDE 20 MG PO TABS
40.0000 mg | ORAL_TABLET | Freq: Every day | ORAL | Status: DC
  Administered 2016-07-03 – 2016-07-04 (×2): 40 mg via ORAL
  Filled 2016-07-03 (×2): qty 2

## 2016-07-03 MED ORDER — ENOXAPARIN SODIUM 40 MG/0.4ML ~~LOC~~ SOLN
40.0000 mg | SUBCUTANEOUS | Status: DC
  Filled 2016-07-03 (×2): qty 0.4

## 2016-07-03 MED ORDER — ACETAMINOPHEN 325 MG PO TABS
650.0000 mg | ORAL_TABLET | Freq: Four times a day (QID) | ORAL | Status: DC | PRN
  Administered 2016-07-04: 09:00:00 650 mg via ORAL
  Filled 2016-07-03: qty 2

## 2016-07-03 NOTE — ED Notes (Signed)
Pt wakes to voice. Is oriented X 3 when woke. NAD

## 2016-07-03 NOTE — Progress Notes (Addendum)
Pt has been up and down today regarding   Her affect. Spells of crying spells of agitation, drowsey  b/t 10-13. But answered dr clapac  When  He saw her.  More alert  This later pm  And up and about in room and  [place].  Pulled iv out. Kept wanting to go out to smoke. Found smoking in bathroom earlier am.  Pt was calmer this pm  Cont to ask for pain med and anxiety meds. md aware. Xanax d/cd. Per md.  Eating well.  Voiding well.pts  Social worker came to see her today.  See caremanager note.  forenic  Nurses saw pt thia am.
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
## 2016-07-03 NOTE — Plan of Care (Signed)
Problem: Safety: Goal: Ability to remain free from injury will improve Steady on feet  Problem: Health Behavior/Discharge Planning: Goal: Ability to manage health-related needs will improve Outcome: Progressing See note  Problem: Pain Managment: Goal: General experience of comfort will improve Outcome: Progressing See med for pain  Problem: Fluid Volume: Goal: Ability to maintain a balanced intake and output will improve Outcome: Progressing Drinking fluids well.   Pt kept taking  ivf fluids apart.instructed  Pt not to  Mess with fluids. md d/cd  flluids later today
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
## 2016-07-04 LAB — HEPATITIS C ANTIBODY: HCV Ab: <0.1 s/co ratio (ref 0.0–0.9)

## 2016-07-04 LAB — HIV ANTIBODY (ROUTINE TESTING W REFLEX): HIV Screen 4th Generation wRfx: NONREACTIVE

## 2016-07-04 LAB — HEMOGLOBIN A1C
Hgb A1c MFr Bld: 5.6 % (ref 4.8–5.6)
Mean Plasma Glucose: 114 mg/dL

## 2016-07-04 LAB — HEPATITIS B SURFACE ANTIGEN: Hepatitis B Surface Ag: NEGATIVE

## 2016-07-04 MED ORDER — ALPRAZOLAM 1 MG PO TABS: 1.0000 mg | ORAL_TABLET | Freq: Three times a day (TID) | ORAL | 0 refills | Status: AC

## 2016-07-04 MED ORDER — GABAPENTIN 300 MG PO CAPS: 300.0000 mg | ORAL_CAPSULE | Freq: Three times a day (TID) | ORAL | 1 refills | Status: AC

## 2016-07-04 MED ORDER — ELVITEG-COBIC-EMTRICIT-TENOFAF 150-150-200-10 MG PO TABS: 1.0000 | ORAL_TABLET | Freq: Every day | ORAL | 0 refills | Status: DC

## 2016-07-04 MED ORDER — BUPRENORPHINE HCL-NALOXONE HCL 8-2 MG SL SUBL
1.0000 | SUBLINGUAL_TABLET | Freq: Every day | SUBLINGUAL | Status: DC
  Administered 2016-07-04: 1 via SUBLINGUAL
  Filled 2016-07-04: qty 1

## 2016-07-04 MED ORDER — ELVITEG-COBIC-EMTRICIT-TENOFAF 150-150-200-10 MG PO TABS: 1.0000 | ORAL_TABLET | Freq: Every day | ORAL | 0 refills | Status: AC
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
## 2016-07-04 NOTE — SANE Note (Signed)
ED charge nurse Gearldine Bienenstock) states patient has been admitted to room 114. Called patient's inpatient nurse Baltazar Najjar), she states patient is awake, alert and oriented. Informed [place] that I would be there within the hour for consult.
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
## 2016-07-04 NOTE — Care Management Note (Signed)
Case Management Note  Patient Details  Name: Andrea Gamble MRN: [id number] Date of Birth: 08/02/1985  Subjective/Objective:    [name] reports that she will be staying with a friend at [address] [place], [place]   The [facility] pharmacy will provide 26 days of Genvoya ( PEP SANE medication) for [name]. A request for [facility] fund re-embursement to the [facility] has been submitted. Also, a PEP Letter of Necessity has been faxed to the [facility], Fax: [phone].               Action/Plan:   Expected Discharge Date:  07/04/16               Expected Discharge Plan:     In-House Referral:     Discharge planning Services     Post Acute Care Choice:    Choice offered to:     DME Arranged:    DME Agency:     HH Arranged:    HH Agency:     Status of Service:     If discussed at [employer], dates discussed:    Additional Comments:  Faron Whitelock A, RN 07/04/2016, 2:34 PM

## 2016-07-04 NOTE — Progress Notes (Signed)
Discharge instructions along with home medications and follow up gone over with patient. She verbalizes that she understood instructions. Two prescriptions given to patient. Pt being discharged home on room air, no distress noted. Otilio Jefferson, RN
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
## 2016-07-04 NOTE — Discharge Instructions (Signed)
[facility] - [place] at [facility] ° °DIET:  °Regular diet ° °DISCHARGE CONDITION:  °Stable ° °ACTIVITY:  °Activity as tolerated ° °OXYGEN:  °Home Oxygen: No. °  °Oxygen Delivery: room air ° °DISCHARGE LOCATION:  °home  ° ° °ADDITIONAL DISCHARGE INSTRUCTION: ° ° °If you experience worsening of your admission symptoms, develop shortness of breath, life threatening emergency, suicidal or homicidal thoughts you must seek medical attention immediately by calling 911 or calling your MD immediately  if symptoms less severe. ° °You Must read complete instructions/literature along with all the possible adverse reactions/side effects for all the Medicines you take and that have been prescribed to you. Take any new Medicines after you have completely understood and accpet all the possible adverse reactions/side effects.  ° °Please note ° °You were cared for by a hospitalist during your hospital stay. If you have any questions about your discharge medications or the care you received while you were in the hospital after you are discharged, you can call the unit and asked to speak with the hospitalist on call if the hospitalist that took care of you is not available. Once you are discharged, your primary care physician will handle any further medical issues. Please note that NO REFILLS for any discharge medications will be authorized once you are discharged, as it is imperative that you return to your primary care physician (or establish a relationship with a primary care physician if you do not have one) for your aftercare needs so that they can reassess your need for medications and monitor your lab values. ° ° °
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
## 2016-07-04 NOTE — SANE Note (Signed)
Genvoya order converted to inpatient administration order. New CM consult request placed for assistance with obtaining medication prior to discharge.

## 2016-07-04 NOTE — SANE Note (Signed)
Discussed HIV nPEP with Dr. Sherryll Burger, via telephone, he stated he would order the medication. Talked with Baltazar Najjar about HIV nPEP and the need to start it ASAP. Labs reviewed with [facility]. CBC and CMP results are available. [place] requesting the lab change the previous HIV lab order to rapid HIV. Orders entered and CM consult requested.

## 2016-07-04 NOTE — SANE Note (Signed)
Report received from [facility] [place] stated patient would not wake up for SANE consult around 0545 this am. Will call ED within a couple of hours to inquire on patient's LOC.

## 2016-07-04 NOTE — Progress Notes (Signed)
Bottle of celexa sent to pharmacy for storage until patient discharged.

## 2016-07-06 NOTE — SANE Note (Signed)
Encrypted chart sent via email to Det Katherina Right with [place] [employer].

## 2016-07-16 ENCOUNTER — Emergency Department
Admission: EM | Admit: 2016-07-16 | Discharge: 2016-07-16 | Disposition: A | Discharge status: Home / Self Care | Payer: Self-pay | Attending: Emergency Medicine | Admitting: Emergency Medicine

## 2016-07-16 ENCOUNTER — Encounter: Payer: Self-pay | Admitting: Emergency Medicine

## 2016-07-16 DIAGNOSIS — Z79899 Other long term (current) drug therapy: Secondary | ICD-10-CM | POA: Insufficient documentation

## 2016-07-16 DIAGNOSIS — F17210 Nicotine dependence, cigarettes, uncomplicated: Secondary | ICD-10-CM | POA: Insufficient documentation

## 2016-07-16 DIAGNOSIS — F1320 Sedative, hypnotic or anxiolytic dependence, uncomplicated: Secondary | ICD-10-CM | POA: Diagnosis present

## 2016-07-16 DIAGNOSIS — F909 Attention-deficit hyperactivity disorder, unspecified type: Secondary | ICD-10-CM | POA: Insufficient documentation

## 2016-07-16 DIAGNOSIS — F4311 Post-traumatic stress disorder, acute: Secondary | ICD-10-CM

## 2016-07-16 DIAGNOSIS — F4010 Social phobia, unspecified: Secondary | ICD-10-CM | POA: Diagnosis present

## 2016-07-16 DIAGNOSIS — F4310 Post-traumatic stress disorder, unspecified: Secondary | ICD-10-CM | POA: Insufficient documentation

## 2016-07-16 DIAGNOSIS — F419 Anxiety disorder, unspecified: Secondary | ICD-10-CM | POA: Insufficient documentation

## 2016-07-16 NOTE — ED Notes (Signed)
Pt given information about [facility], Medication management clinic, [facility] and [facility] for follow up. Pt verbalizes importance of following up regarding HIV and anxiety medications. Pt verbalizes understanding. Pt ambulatory to discharge.
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
## 2016-07-16 NOTE — ED Triage Notes (Signed)
This RN asked patient exactly what she wanted from this visit with Judeth Cornfield, Charity fundraiser. Pt denies SI/HI, pt denies wanting to speak to a psychiatrist. Pt states "I want my medications refilled so that I can sleep", in reference to her Xanax.
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
## 2016-09-15 ENCOUNTER — Emergency Department
Admission: EM | Admit: 2016-09-15 | Discharge: 2016-09-15 | Disposition: A | Discharge status: Home / Self Care | Payer: No Typology Code available for payment source | Attending: Emergency Medicine | Admitting: Emergency Medicine

## 2016-09-15 ENCOUNTER — Encounter: Payer: Self-pay | Admitting: Emergency Medicine

## 2016-09-15 DIAGNOSIS — N926 Irregular menstruation, unspecified: Secondary | ICD-10-CM | POA: Insufficient documentation

## 2016-09-15 DIAGNOSIS — Z79899 Other long term (current) drug therapy: Secondary | ICD-10-CM | POA: Insufficient documentation

## 2016-09-15 DIAGNOSIS — N739 Female pelvic inflammatory disease, unspecified: Secondary | ICD-10-CM | POA: Insufficient documentation

## 2016-09-15 DIAGNOSIS — N939 Abnormal uterine and vaginal bleeding, unspecified: Secondary | ICD-10-CM | POA: Insufficient documentation

## 2016-09-15 DIAGNOSIS — N730 Acute parametritis and pelvic cellulitis: Secondary | ICD-10-CM

## 2016-09-15 LAB — URINALYSIS, COMPLETE (UACMP) WITH MICROSCOPIC
Bacteria, UA: NONE SEEN
Bilirubin Urine: NEGATIVE
GLUCOSE, UA: NEGATIVE mg/dL
Ketones, ur: 5 mg/dL — AB
Leukocytes, UA: NEGATIVE
Nitrite: NEGATIVE
PH: 6 (ref 5.0–8.0)
Protein, ur: NEGATIVE mg/dL
SPECIFIC GRAVITY, URINE: 1.028 (ref 1.005–1.030)

## 2016-09-15 LAB — CBC
HCT: 44.9 % (ref 35.0–47.0)
Hemoglobin: 15.6 g/dL (ref 12.0–16.0)
MCH: 31.9 pg (ref 26.0–34.0)
MCHC: 34.6 g/dL (ref 32.0–36.0)
MCV: 92.2 fL (ref 80.0–100.0)
Platelets: 408 10*3/uL (ref 150–440)
RBC: 4.87 MIL/uL (ref 3.80–5.20)
RDW: 13.4 % (ref 11.5–14.5)
WBC: 8.1 10*3/uL (ref 3.6–11.0)

## 2016-09-15 LAB — TYPE AND SCREEN
ABO/RH(D): O POS
ANTIBODY SCREEN: NEGATIVE

## 2016-09-15 LAB — HCG, QUANTITATIVE, PREGNANCY: hCG, Beta Chain, Quant, S: 1 m[IU]/mL (ref <5)

## 2016-09-15 LAB — BASIC METABOLIC PANEL
Anion gap: 9 (ref 5–15)
BUN: 15 mg/dL (ref 6–20)
CALCIUM: 9.7 mg/dL (ref 8.9–10.3)
CHLORIDE: 102 mmol/L (ref 101–111)
CO2: 28 mmol/L (ref 22–32)
CREATININE: 0.92 mg/dL (ref 0.44–1.00)
GFR calc non Af Amer: >60 mL/min (ref >60)
Glucose, Bld: 91 mg/dL (ref 65–99)
Potassium: 3.7 mmol/L (ref 3.5–5.1)
Sodium: 139 mmol/L (ref 135–145)

## 2016-09-15 LAB — WET PREP, GENITAL
Sperm: NONE SEEN
TRICH WET PREP: NONE SEEN
YEAST WET PREP: NONE SEEN

## 2016-09-15 LAB — CHLAMYDIA/NGC RT PCR (ARMC ONLY)
Chlamydia Tr: NOT DETECTED
N GONORRHOEAE: NOT DETECTED

## 2016-09-15 LAB — PREGNANCY, URINE: PREG TEST UR: NEGATIVE

## 2016-09-15 MED ORDER — IBUPROFEN 800 MG PO TABS
800.0000 mg | ORAL_TABLET | Freq: Once | ORAL | Status: AC
Start: 2016-09-15 — End: 2016-09-15
  Administered 2016-09-15: 800 mg via ORAL

## 2016-09-15 MED ORDER — DOXYCYCLINE HYCLATE 100 MG PO TABS: 100.0000 mg | ORAL_TABLET | Freq: Two times a day (BID) | ORAL | 0 refills | Status: DC

## 2016-09-15 MED ORDER — METRONIDAZOLE 500 MG PO TABS: 500.0000 mg | ORAL_TABLET | Freq: Two times a day (BID) | ORAL | 0 refills | Status: AC

## 2016-09-15 MED ORDER — IBUPROFEN 600 MG PO TABS: 600.0000 mg | ORAL_TABLET | Freq: Four times a day (QID) | ORAL | 0 refills | Status: DC | PRN

## 2016-09-15 MED ORDER — LEVOFLOXACIN 500 MG PO TABS: 500.0000 mg | ORAL_TABLET | Freq: Every day | ORAL | 0 refills | Status: AC

## 2016-09-15 NOTE — ED Provider Notes (Addendum)
[facility] Emergency Department Provider Note  Time seen: 2:41 PM  I have reviewed the triage vital signs and the nursing notes.   HISTORY  Chief Complaint Vaginal Bleeding    HPI Andrea Gamble is a 31 y.o. female with a past medical history of anxiety, PTSD, presents to the emergency department for vaginal bleeding. According to the patient for the past 2 weeks she has been experiencing vaginal bleeding. She states prior to that she had not had a period for almost 2 months which is abnormal for her. Did not take any home pregnancy tests, but is sexually active. She also states she was experiencing foul-smelling vaginal discharge prior to the vaginal bleeding. States a history of PID/STDs in the past. States moderate lower abdominal discomfort/cramping. Denies any dysuria. Denies any nausea vomiting or diarrhea. States occasional cough but otherwise negative review of systems.  Past Medical History:  Diagnosis Date  . ADHD (attention deficit hyperactivity disorder)   . Anxiety   . Heart murmur   . PTSD (post-traumatic stress disorder)     Patient Active Problem List   Diagnosis Date Noted  . Acute post-traumatic stress disorder 07/16/2016  . Hypotension 07/03/2016  . 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 Opiate withdrawal (HCC) 12/04/2015  . Opiate dependence (HCC) 12/04/2015  . Benzodiazepine dependence (HCC) 12/04/2015  . Social anxiety disorder 12/04/2015    Past Surgical History:  Procedure Laterality Date  . BACK SURGERY    . CESAREAN SECTION Bilateral     Prior to Admission medications   Medication Sig Start Date End Date Taking? Authorizing Provider  albuterol (PROVENTIL HFA;VENTOLIN HFA) 108 (90 Base) MCG/ACT inhaler Inhale 2 puffs into the lungs every 6 (six) hours as needed for wheezing or shortness of breath.    [provider]  ALPRAZolam Prudy Feeler) 1 MG tablet Take 1 tablet (1 mg total)  by mouth 3 (three) times daily. 07/04/16   Auburn Bilberry, MD  citalopram (CELEXA) 40 MG tablet Take 40 mg by mouth 2 (two) times daily.    [provider]  gabapentin (NEURONTIN) 300 MG capsule Take 1 capsule (300 mg total) by mouth 3 (three) times daily. 07/04/16 07/04/17  Auburn Bilberry, MD  medroxyPROGESTERone (PROVERA) 10 MG tablet Take 1 tablet (10 mg total) by mouth daily. Patient not taking: Reported on 07/03/2016 11/30/15 11/29/16  Schaevitz, Myra Rude, MD  meloxicam (MOBIC) 15 MG tablet Take 1 tablet (15 mg total) by mouth daily. Patient not taking: Reported on 07/03/2016 03/04/16   Chinita Pester, FNP    Allergies  Allergen Reactions  . Clindamycin/Lincomycin Anaphylaxis  . Keflex [Cephalexin] Anaphylaxis  . Penicillins Anaphylaxis    Has patient had a PCN reaction causing immediate rash, facial/tongue/throat swelling, SOB or lightheadedness with hypotension: Yes Has patient had a PCN reaction causing severe rash involving mucus membranes or skin necrosis: No Has patient had a PCN reaction that required hospitalization Yes Has patient had a PCN reaction occurring within the last 10 years: No If all of the above answers are "NO", then may proceed with Cephalosporin use.  . Sulfur Anaphylaxis    No family history on file.  Social History Social History  Substance Use Topics  . Smoking status: Current Every Day Smoker    Packs/day: 0.50    Types: Cigarettes  . Smokeless tobacco: Never Used  . Alcohol use No    Review of Systems Constitutional: Negative for fever. Cardiovascular: Negative for chest pain.  Respiratory: Negative for shortness of breath.Occasional cough. Gastrointestinal: Lower abdominal pain/cramping Genitourinary: Positive for vaginal discharge. Positive for vaginal bleeding. Musculoskeletal: Negative for back pain. Neurological: Negative for headache All other ROS negative  ____________________________________________   PHYSICAL EXAM:  VITAL  SIGNS: ED Triage Vitals  Enc Vitals Group     BP 09/15/16 1328 134/86     Pulse Rate 09/15/16 1328 90     Resp 09/15/16 1328 18     Temp --      Temp Source 09/15/16 1328 Oral     SpO2 09/15/16 1328 100 %     Weight 09/15/16 1332 120 lb (54.4 kg)     Height 09/15/16 1332 4\' 11"  (1.499 m)     Head Circumference --      Peak Flow --      Pain Score 09/15/16 1328 6     Pain Loc --      Pain Edu? --      Excl. in GC? --     Constitutional: Alert and oriented. Well appearing and in no distress. Eyes: Normal exam ENT   Head: Normocephalic and atraumatic.   Mouth/Throat: Mucous membranes are moist. Cardiovascular: Normal rate, regular rhythm. No murmur Respiratory: Normal respiratory effort without tachypnea nor retractions. Breath sounds are clear  Gastrointestinal: Soft, mild suprapubic tenderness palpation. No rebound or guarding. No distention. Musculoskeletal: Nontender with normal range of motion in all extremities.  Neurologic:  Normal speech and language. No gross focal neurologic deficits Skin:  Skin is warm, dry and intact.  Psychiatric: Mood and affect are normal.   ____________________________________________  INITIAL IMPRESSION / ASSESSMENT AND PLAN / ED COURSE  Pertinent labs & imaging results that were available during my care of the patient were reviewed by me and considered in my medical decision making (see chart for details).  The patient presents to the emergency department for vaginal bleeding ongoing for the past 2 weeks. She does state vaginal discharge prior to bleeding. We'll perform a pelvic exam and send swabs. We will check labs including a quantitative beta hCG. Overall the patient appears very well, mild suprapubic tenderness otherwise normal exam.  Pregnancy test negative. Moderate vaginal discharge with cervical motion tenderness, No vaginal bleeding, we will cover her for pelvic inflammatory disease with IM Rocephin and  doxycycline.  ____________________________________________   FINAL CLINICAL IMPRESSION(S) / ED DIAGNOSES  Lower abdominal pain Vaginal bleeding Pelvic inflammatory disease    Minna AntisPaduchowski, Bryndle Corredor, MD 09/15/16 [id number]    Minna AntisPaduchowski, Brita Jurgensen, MD 09/15/16 1523

## 2016-09-15 NOTE — Discharge Instructions (Signed)
Please follow-up with a primary care doctor or OB/GYN within the next 3-5 days for recheck/reevaluation. Return to the emergency department for any fever, worsening pain, or any other symptom personally concerning to yourself.
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
## 2016-09-15 NOTE — ED Notes (Signed)
Pt requesting additional pain medication, MD Paduchowski informed. No new orders

## 2019-09-21 ENCOUNTER — Ambulatory Visit: Payer: Self-pay

## 2020-01-16 ENCOUNTER — Encounter: Payer: Self-pay | Admitting: Advanced Practice Midwife

## 2020-01-16 ENCOUNTER — Ambulatory Visit: Payer: Self-pay | Admitting: Advanced Practice Midwife

## 2020-01-16 ENCOUNTER — Ambulatory Visit: Payer: Medicaid Other

## 2020-01-16 ENCOUNTER — Other Ambulatory Visit: Payer: Self-pay

## 2020-01-16 DIAGNOSIS — T7422XS Child sexual abuse, confirmed, sequela: Secondary | ICD-10-CM

## 2020-01-16 DIAGNOSIS — F17200 Nicotine dependence, unspecified, uncomplicated: Secondary | ICD-10-CM

## 2020-01-16 DIAGNOSIS — F319 Bipolar disorder, unspecified: Secondary | ICD-10-CM

## 2020-01-16 DIAGNOSIS — Z113 Encounter for screening for infections with a predominantly sexual mode of transmission: Secondary | ICD-10-CM

## 2020-01-16 DIAGNOSIS — T7422XA Child sexual abuse, confirmed, initial encounter: Secondary | ICD-10-CM | POA: Insufficient documentation

## 2020-01-16 DIAGNOSIS — F1120 Opioid dependence, uncomplicated: Secondary | ICD-10-CM

## 2020-01-16 DIAGNOSIS — F1110 Opioid abuse, uncomplicated: Secondary | ICD-10-CM | POA: Insufficient documentation

## 2020-01-16 DIAGNOSIS — F1410 Cocaine abuse, uncomplicated: Secondary | ICD-10-CM

## 2020-01-16 DIAGNOSIS — A599 Trichomoniasis, unspecified: Secondary | ICD-10-CM

## 2020-01-16 LAB — WET PREP FOR TRICH, YEAST, CLUE
Trichomonas Exam: POSITIVE — AB
Yeast Exam: NEGATIVE

## 2020-01-16 MED ORDER — METRONIDAZOLE 500 MG PO TABS: 500.0000 mg | ORAL_TABLET | Freq: Three times a day (TID) | ORAL | 0 refills | Status: DC

## 2020-01-16 MED ORDER — METRONIDAZOLE 500 MG PO TABS: 500.0000 mg | ORAL_TABLET | Freq: Two times a day (BID) | ORAL | 0 refills | Status: AC

## 2020-01-16 NOTE — Progress Notes (Signed)
Wet mount reviewed by Hazle Coca CNM and per her verbal order, treat for trich and BV as per standing orders. Jossie Ng, RN

## 2020-01-16 NOTE — Progress Notes (Signed)
[facility] Department STI clinic/screening visit  Subjective:  Andrea Gamble is a 34 y.o. SWF G7P4 smoker female being seen today for an STI screening visit. The patient reports they do have symptoms.  Patient reports that they do not desire a pregnancy in the next year.   They reported they are not interested in discussing contraception today.  No LMP recorded.   Patient has the following medical conditions:   Patient Active Problem List   Diagnosis Date Noted  . Smoker 1 ppd 01/16/2020  . Heroin abuse (HCC) 01/16/2020  . Cocaine abuse (HCC) 01/16/2020  . 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 Bipolar 1 disorder (HCC) 01/16/2020  . Acute post-traumatic stress disorder 07/16/2016  . Hypotension 07/03/2016  . 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 Opiate withdrawal (HCC) 12/04/2015  . Opiate dependence (HCC) 12/04/2015  . Benzodiazepine dependence (HCC) 12/04/2015  . Social anxiety disorder 12/04/2015    Chief Complaint  Patient presents with  . SEXUALLY TRANSMITTED DISEASE    HPI  Patient reports dysuria x 2 days, sex hurts past couple weeks, thick [place] malodorous d/c x couple weeks.  LMP 01/14/20.  Last sex 01/10/20 without condom; with current partner x 4 years.  Smoker 1 ppd.  Last douched 1 week ago.  Last HIV test per patient/review of record was 07/03/16 Patient reports last pap was "a long time ago"  See flowsheet for further details and programmatic requirements.    The following portions of the patient's history were reviewed and updated as appropriate: allergies, current medications, past medical history, past social history, past surgical history and problem list.  Objective:  There were no vitals filed for this visit.  Physical Exam Vitals and nursing note reviewed.  Constitutional:      Appearance: Normal appearance.  HENT:     Head:  Normocephalic and atraumatic.     Mouth/Throat:     Mouth: Mucous membranes are moist.     Pharynx: Oropharynx is clear. No oropharyngeal exudate or posterior oropharyngeal erythema.  Pulmonary:     Effort: Pulmonary effort is normal.  Abdominal:     Palpations: Abdomen is soft. There is no mass.     Tenderness: There is no abdominal tenderness. There is no rebound.     Comments: Poor tone, soft without masses or tenderness  Genitourinary:    General: Normal vulva.     Exam position: Lithotomy position.     Pubic Area: No rash or pubic lice.      Labia:        Right: No rash or lesion.        Left: No rash or lesion.      Vagina: Vaginal discharge (thick [place] malodorous leukorrhea, ph>4.5) present. No erythema, bleeding or lesions.     Cervix: Normal.     Uterus: Normal.      Adnexa: Right adnexa normal and left adnexa normal.     Rectum: Normal.  Lymphadenopathy:     Head:     Right side of head: No preauricular or posterior auricular adenopathy.     Left side of head: No preauricular or posterior auricular adenopathy.     Cervical: No cervical adenopathy.     Upper Body:     Right upper body: No supraclavicular or axillary adenopathy.     Left upper body: No supraclavicular or axillary adenopathy.     Lower Body: No  right inguinal adenopathy. No left inguinal adenopathy.  Skin:    General: Skin is warm and dry.     Findings: No rash.  Neurological:     Mental Status: She is alert and oriented to person, place, and time.      Assessment and Plan:  Andrea Gamble is a 34 y.o. female presenting to the [facility] Department for STI screening  1. Screening examination for venereal disease Treat wet mount per stanading orders Immunization nurse consult - WET PREP FOR TRICH, YEAST, CLUE - Chlamydia/Gonorrhea [facility]  2. Smoker 1 ppd Counseled via 5 A's to stop smoking  3. Heroin abuse (HCC) Last use this am  4. Cocaine abuse (HCC) Last use  yesterday  5. 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 Percocet use disorder, mild, abuse (HCC) Last use 2019  7. Bipolar 1 disorder (HCC)      No follow-ups on file.  No future appointments.  Alberteen Spindle, CNM

## 2020-01-21 LAB — GONOCOCCUS CULTURE

## 2020-05-15 ENCOUNTER — Ambulatory Visit: Payer: Medicaid Other

## 2020-05-15 ENCOUNTER — Encounter: Payer: Self-pay | Admitting: Physician Assistant

## 2020-05-15 ENCOUNTER — Ambulatory Visit (LOCAL_COMMUNITY_HEALTH_CENTER): Payer: Medicaid Other | Admitting: Physician Assistant

## 2020-05-15 ENCOUNTER — Other Ambulatory Visit: Payer: Self-pay

## 2020-05-15 VITALS — BP 107/69 | Ht 59.0 in | Wt 130.0 lb

## 2020-05-15 DIAGNOSIS — Z01419 Encounter for gynecological examination (general) (routine) without abnormal findings: Secondary | ICD-10-CM | POA: Diagnosis not present

## 2020-05-15 DIAGNOSIS — Z3009 Encounter for other general counseling and advice on contraception: Secondary | ICD-10-CM | POA: Diagnosis not present

## 2020-05-15 DIAGNOSIS — Z113 Encounter for screening for infections with a predominantly sexual mode of transmission: Secondary | ICD-10-CM

## 2020-05-15 LAB — PREGNANCY, URINE: Preg Test, Ur: NEGATIVE

## 2020-05-15 LAB — WET PREP FOR TRICH, YEAST, CLUE
Trichomonas Exam: NEGATIVE
Yeast Exam: NEGATIVE
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
## 2020-05-15 NOTE — Progress Notes (Signed)
Pt declined condoms. Pt accepted [employer] and LCSW business cards and list of mental health providers. Wet mount and UPT reviewed by provider, no tx per provider orders. Provider orders completed.

## 2020-05-16 ENCOUNTER — Encounter: Payer: Self-pay | Admitting: Physician Assistant
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
## 2020-05-19 LAB — IGP, APTIMA HPV
HPV Aptima: NEGATIVE
PAP Smear Comment: 0

## 2021-07-16 ENCOUNTER — Other Ambulatory Visit: Payer: Self-pay

## 2021-07-16 ENCOUNTER — Emergency Department: Payer: Self-pay

## 2021-07-16 ENCOUNTER — Emergency Department
Admission: EM | Admit: 2021-07-16 | Discharge: 2021-07-16 | Disposition: A | Discharge status: Home / Self Care | Payer: Self-pay | Attending: Emergency Medicine | Admitting: Emergency Medicine

## 2021-07-16 DIAGNOSIS — L03211 Cellulitis of face: Secondary | ICD-10-CM

## 2021-07-16 LAB — CBC
HCT: 34.4 % — ABNORMAL LOW (ref 36.0–46.0)
Hemoglobin: 11.2 g/dL — ABNORMAL LOW (ref 12.0–15.0)
MCH: 29.7 pg (ref 26.0–34.0)
MCHC: 32.6 g/dL (ref 30.0–36.0)
MCV: 91.2 fL (ref 80.0–100.0)
Platelets: 310 10*3/uL (ref 150–400)
RBC: 3.77 MIL/uL — ABNORMAL LOW (ref 3.87–5.11)
RDW: 14.3 % (ref 11.5–15.5)
WBC: 7.9 10*3/uL (ref 4.0–10.5)
nRBC: 0 % (ref 0.0–0.2)

## 2021-07-16 LAB — BASIC METABOLIC PANEL
Anion gap: 6 (ref 5–15)
BUN: 9 mg/dL (ref 6–20)
CO2: 28 mmol/L (ref 22–32)
Calcium: 8.7 mg/dL — ABNORMAL LOW (ref 8.9–10.3)
Chloride: 101 mmol/L (ref 98–111)
Creatinine, Ser: 0.65 mg/dL (ref 0.44–1.00)
GFR, Estimated: >60 mL/min (ref >60)
Glucose, Bld: 100 mg/dL — ABNORMAL HIGH (ref 70–99)
Potassium: 4 mmol/L (ref 3.5–5.1)
Sodium: 135 mmol/L (ref 135–145)

## 2021-07-16 LAB — LACTIC ACID, PLASMA: Lactic Acid, Venous: 0.6 mmol/L (ref 0.5–1.9)

## 2021-07-16 MED ORDER — SODIUM CHLORIDE 0.9 % IV SOLN
2.0000 g | Freq: Once | INTRAVENOUS | Status: AC
  Administered 2021-07-16: 2 g via INTRAVENOUS
  Filled 2021-07-16: qty 20

## 2021-07-16 MED ORDER — DEXTROSE 5 % IV SOLN
1500.0000 mg | Freq: Once | INTRAVENOUS | Status: AC
  Administered 2021-07-16: 1500 mg via INTRAVENOUS
  Filled 2021-07-16: qty 75

## 2021-07-16 MED ORDER — IOHEXOL 300 MG/ML  SOLN
75.0000 mL | Freq: Once | INTRAMUSCULAR | Status: AC | PRN
  Administered 2021-07-16: 75 mL via INTRAVENOUS

## 2021-07-16 MED ORDER — VANCOMYCIN HCL IN DEXTROSE 1-5 GM/200ML-% IV SOLN
1000.0000 mg | Freq: Once | INTRAVENOUS | Status: DC
  Filled 2021-07-16: qty 200

## 2021-07-16 MED ORDER — IOHEXOL 300 MG/ML  SOLN: 75.0000 mL | Freq: Once | INTRAMUSCULAR | Status: DC | PRN

## 2021-07-16 MED ORDER — MUPIROCIN 2 % EX OINT: 1.0000 "application " | TOPICAL_OINTMENT | Freq: Two times a day (BID) | CUTANEOUS | 0 refills | Status: AC
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
## 2021-07-16 NOTE — ED Notes (Signed)
Pt verified correct name and birthday and new bracelet placed on pt.  ?

## 2021-07-16 NOTE — ED Notes (Signed)
Pt assisted to the restroom & provided a sandwich box.  ?

## 2021-07-16 NOTE — ED Notes (Signed)
Pt to CT

## 2021-07-16 NOTE — ED Notes (Signed)
Pt refusing vitals "until I get something to eat".  ?

## 2021-07-16 NOTE — ED Notes (Signed)
This RN was not comfortable giving any medications to this pt until she could give the correct name and birthday after speaking to pt and making her aware that is not our job and it is against HIPPA to call the police about her warrants which is whys he lied about name and birthday initially. MD is made aware that this RN will not medicate this pt until she is able to give correct name and birthday. ? ?Pt offered [place] sandwich and pt then told her correct name which is [employer].  ?

## 2021-07-16 NOTE — Discharge Instructions (Signed)
You were given an IV antibiotic that should be enough to treat your face. ? ?I would recommend applying triple antibiotic ointment to your face.  I have also prescribed an ointment if you are able to fill it. ? ? ?

## 2021-07-16 NOTE — ED Notes (Signed)
MD aware pt is lying about name and DOB and requests this RN still give ABX.   ?

## 2021-07-16 NOTE — ED Notes (Signed)
[place] [employer] here to let this RN and staff know that this pt is lying about her identify due to a warrant.  ? ?Police state pt's real name is Andrea Gamble and when this name and DOB are placed in system, pt's picture does appear to look like her. Pt is still denying this is her real name and birthday but did answer to the name "[name]" when called.  ?

## 2021-07-16 NOTE — ED Notes (Signed)
Several attempts made by this RN to contact a family member or friend to provide the patient a ride. The attempts were unsuccessful - pt provided a list of phone numbers to contact them at a later time and shown where the telephone is in the lobby. Pt verbalized understanding of D/C information. No additional concerns at this time.  ?
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
## 2021-07-16 NOTE — ED Notes (Signed)
Pt still refusing vitals for this RN ?

## 2021-07-16 NOTE — Consult Note (Signed)
PHARMACY -  BRIEF ANTIBIOTIC NOTE  ? ?Pharmacy has received consult(s) for Vancomycin from an ED provider.  The patient's profile has been reviewed for ht/wt/allergies/indication/available labs.   ? ?One time order(s) placed for  ?Vancomycin 1000 mg  ? ?Further antibiotics/pharmacy consults should be ordered by admitting physician if indicated.       ?                ?Thank you, ?Sharen Hones, PharmD, BCPS ?Clinical Pharmacist   ?07/16/2021  4:17 PM  ?
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
## 2021-07-16 NOTE — ED Notes (Signed)
CT called this RN to let me know pt is still not using her correct name and birthday, CT refusing to scan pt at this time. Pt states "no one wants to feed me or give me something to drink". Pt educated this a ER and we need to treat her for her problem first.  ?

## 2021-07-16 NOTE — ED Notes (Signed)
Pt back from CT

## 2021-07-16 NOTE — ED Notes (Signed)
Pt given warm blankets and [place] sandwich as well as a soda.  ?

## 2021-07-22 ENCOUNTER — Other Ambulatory Visit (HOSPITAL_COMMUNITY): Payer: Self-pay

## 2021-07-22 LAB — CULTURE, BLOOD (SINGLE): Culture: NO GROWTH

## 2023-05-31 ENCOUNTER — Other Ambulatory Visit: Payer: Self-pay

## 2023-05-31 ENCOUNTER — Emergency Department
Admission: EM | Admit: 2023-05-31 | Discharge: 2023-05-31 | Disposition: A | Discharge status: Home / Self Care | Payer: Self-pay | Attending: Emergency Medicine | Admitting: Emergency Medicine

## 2023-05-31 DIAGNOSIS — H66012 Acute suppurative otitis media with spontaneous rupture of ear drum, left ear: Secondary | ICD-10-CM | POA: Insufficient documentation

## 2023-05-31 MED ORDER — IBUPROFEN 600 MG PO TABS
600.0000 mg | ORAL_TABLET | Freq: Once | ORAL | Status: AC
  Administered 2023-05-31: 600 mg via ORAL
  Filled 2023-05-31: qty 1

## 2023-05-31 MED ORDER — AZITHROMYCIN 500 MG PO TABS: 500.0000 mg | ORAL_TABLET | Freq: Every day | ORAL | 0 refills | Status: AC

## 2023-05-31 MED ORDER — IBUPROFEN 600 MG PO TABS: 600.0000 mg | ORAL_TABLET | Freq: Three times a day (TID) | ORAL | 0 refills | Status: AC | PRN

## 2023-05-31 NOTE — ED Triage Notes (Signed)
 Pt sts that she has been having left ear pain and the feeling that it is full for the last two days. Pt sts that she has take tylenol and BC powders.

## 2023-05-31 NOTE — Discharge Instructions (Addendum)
 You have been diagnosed with left suppurative otitis media.  Please take azithromycin 1 tablet by mouth daily for 10 days.  Please take ibuprofen ibuprofen 1 tablet by mouth every 8 hours after meals.  Please come back to ED or go to your PCP if you have new symptoms or symptoms worsen.

## 2023-05-31 NOTE — ED Provider Notes (Signed)
 [facility] Provider Note    Event Date/Time   First MD Initiated Contact with Patient 05/31/23 1511     (approximate)   History   Ear Fullness   HPI  Andrea Gamble is a 38 y.o. female  who presents today with history of 8 days with cold-like symptoms, the last 3 days patient developed otalgia, otorrhea.  Patient states has been taking ibuprofen without any improvement.      Physical Exam   Triage Vital Signs: ED Triage Vitals [05/31/23 1341]  Encounter Vitals Group     BP 137/73     Systolic BP Percentile      Diastolic BP Percentile      Pulse Rate 80     Resp 17     Temp 98 F (36.7 C)     Temp Source Oral     SpO2 100 %     Weight 140 lb (63.5 kg)     Height 4\' 11"  (1.499 m)     Head Circumference      Peak Flow      Pain Score 9     Pain Loc      Pain Education      Exclude from Growth Chart     Most recent vital signs: Vitals:   05/31/23 1341  BP: 137/73  Pulse: 80  Resp: 17  Temp: 98 F (36.7 C)  SpO2: 100%     Constitutional: Alert crying for being in pain Eyes: Conjunctivae are normal.  Head: Atraumatic. Nose: No congestion/rhinnorhea. Ears: Left ear: Erythema in the tragus.  Patient does not enlarged lymph node.  Tympanic membrane perforated, purulent secretions.  Mouth/Throat: Mucous membranes are moist.   Neck: Painless ROM.  Cardiovascular:   Good peripheral circulation.  Regular rhythm Respiratory: Normal respiratory effort.  No retractions.  No wheezing no rales Gastrointestinal: Soft and nontender.  Musculoskeletal:  no deformity Neurologic:  MAE spontaneously. No gross focal neurologic deficits are appreciated.  Skin:  Skin is warm, dry and intact. No rash noted. Psychiatric: Mood and affect are normal. Speech and behavior are normal.    ED Results / Procedures / Treatments   Labs (all labs ordered are listed, but only abnormal results are displayed) Labs Reviewed - No data to  display   EKG    RADIOLOGY okay I just cannot movement    PROCEDURES:  Critical Care performed:   Procedures   MEDICATIONS ORDERED IN ED: Medications  ibuprofen (ADVIL) tablet 600 mg (has no administration in time range)      IMPRESSION / MDM / ASSESSMENT AND PLAN / ED COURSE  I reviewed the triage vital signs and the nursing notes.  Differential diagnosis includes, but is not limited to, otitis media, otitis externa, sinus infection  Patient's presentation is most consistent with acute, uncomplicated illness.   Patient's diagnosis is consistent with left suppurative otitis media. I did review the patient's allergies and medications.patient is allergic to sulfa, Keflex, and penicillin.  the patient is in stable and satisfactory condition for discharge home  Patient will be discharged home with prescriptions for azithromycin. Patient is to follow up with PCP as needed or otherwise directed. Patient is given ED precautions to return to the ED for any worsening or new symptoms. Discussed plan of care with patient, answered all of patient's questions, Patient agreeable to plan of care. Advised patient to take medications according to the instructions on the label. Discussed possible side effects of new medications.  Patient verbalized understanding.     FINAL CLINICAL IMPRESSION(S) / ED DIAGNOSES   Final diagnoses:  Non-recurrent acute suppurative otitis media of left ear with spontaneous rupture of tympanic membrane     Rx / DC Orders   ED Discharge Orders          Ordered    ibuprofen (ADVIL) 600 MG tablet  Every 8 hours PRN        05/31/23 1529    azithromycin (ZITHROMAX) 500 MG tablet  Daily        05/31/23 1529             Note:  This document was prepared using Dragon voice recognition software and may include unintentional dictation errors.   Gladys Damme, PA-C 05/31/23 1532    Dionne Bucy, MD 05/31/23 [phone]
